# Patient Record
Sex: Female | Born: 1945
Health system: Southern US, Community
[De-identification: ages and names within clinical notes are randomized; demographics above are authoritative.]

## PROBLEM LIST (undated history)

## (undated) DIAGNOSIS — E785 Hyperlipidemia, unspecified: Secondary | ICD-10-CM

## (undated) DIAGNOSIS — G4700 Insomnia, unspecified: Secondary | ICD-10-CM

## (undated) DIAGNOSIS — E349 Endocrine disorder, unspecified: Secondary | ICD-10-CM

## (undated) DIAGNOSIS — E079 Disorder of thyroid, unspecified: Secondary | ICD-10-CM

## (undated) DIAGNOSIS — M8580 Other specified disorders of bone density and structure, unspecified site: Secondary | ICD-10-CM

## (undated) HISTORY — DX: Endocrine disorder, unspecified: E34.9

## (undated) HISTORY — DX: Other specified disorders of bone density and structure, unspecified site: M85.80

## (undated) HISTORY — DX: Disorder of thyroid, unspecified: E07.9

## (undated) HISTORY — PX: NO PAST SURGERIES: SHX2092

## (undated) HISTORY — DX: Hyperlipidemia, unspecified: E78.5

## (undated) HISTORY — DX: Insomnia, unspecified: G47.00

---

## 1998-01-07 ENCOUNTER — Other Ambulatory Visit: Admission: RE | Admit: 1998-01-07 | Discharge: 1998-01-07 | Payer: Self-pay | Admitting: *Deleted

## 1999-01-18 ENCOUNTER — Other Ambulatory Visit: Admission: RE | Admit: 1999-01-18 | Discharge: 1999-01-18 | Payer: Self-pay | Admitting: *Deleted

## 2000-02-01 HISTORY — PX: BREAST CYST ASPIRATION: SHX578

## 2000-03-16 ENCOUNTER — Encounter: Admission: RE | Admit: 2000-03-16 | Discharge: 2000-03-16 | Payer: Self-pay | Admitting: *Deleted

## 2000-03-16 ENCOUNTER — Encounter: Payer: Self-pay | Admitting: *Deleted

## 2000-05-22 ENCOUNTER — Other Ambulatory Visit: Admission: RE | Admit: 2000-05-22 | Discharge: 2000-05-22 | Payer: Self-pay | Admitting: *Deleted

## 2000-09-19 ENCOUNTER — Encounter: Admission: RE | Admit: 2000-09-19 | Discharge: 2000-09-19 | Payer: Self-pay | Admitting: *Deleted

## 2000-09-19 ENCOUNTER — Encounter: Payer: Self-pay | Admitting: *Deleted

## 2000-11-24 ENCOUNTER — Other Ambulatory Visit: Admission: RE | Admit: 2000-11-24 | Discharge: 2000-11-24 | Payer: Self-pay | Admitting: *Deleted

## 2000-11-24 ENCOUNTER — Encounter (INDEPENDENT_AMBULATORY_CARE_PROVIDER_SITE_OTHER): Payer: Self-pay | Admitting: *Deleted

## 2000-11-24 ENCOUNTER — Encounter: Payer: Self-pay | Admitting: *Deleted

## 2000-11-24 ENCOUNTER — Encounter: Admission: RE | Admit: 2000-11-24 | Discharge: 2000-11-24 | Payer: Self-pay | Admitting: *Deleted

## 2001-12-20 ENCOUNTER — Encounter: Admission: RE | Admit: 2001-12-20 | Discharge: 2001-12-20 | Payer: Self-pay | Admitting: *Deleted

## 2001-12-20 ENCOUNTER — Encounter: Payer: Self-pay | Admitting: *Deleted

## 2002-06-07 ENCOUNTER — Emergency Department (HOSPITAL_COMMUNITY): Admission: EM | Admit: 2002-06-07 | Discharge: 2002-06-07 | Payer: Self-pay | Admitting: Emergency Medicine

## 2003-08-21 ENCOUNTER — Encounter: Admission: RE | Admit: 2003-08-21 | Discharge: 2003-08-21 | Payer: Self-pay | Admitting: Gynecology

## 2003-08-22 ENCOUNTER — Other Ambulatory Visit: Admission: RE | Admit: 2003-08-22 | Discharge: 2003-08-22 | Payer: Self-pay | Admitting: Gynecology

## 2004-08-23 ENCOUNTER — Other Ambulatory Visit: Admission: RE | Admit: 2004-08-23 | Discharge: 2004-08-23 | Payer: Self-pay | Admitting: Gynecology

## 2005-03-24 ENCOUNTER — Encounter: Admission: RE | Admit: 2005-03-24 | Discharge: 2005-03-24 | Payer: Self-pay | Admitting: Gynecology

## 2006-01-06 ENCOUNTER — Encounter: Admission: RE | Admit: 2006-01-06 | Discharge: 2006-01-06 | Payer: Self-pay | Admitting: Gynecology

## 2006-01-27 ENCOUNTER — Other Ambulatory Visit: Admission: RE | Admit: 2006-01-27 | Discharge: 2006-01-27 | Payer: Self-pay | Admitting: Gynecology

## 2006-05-22 ENCOUNTER — Encounter: Admission: RE | Admit: 2006-05-22 | Discharge: 2006-05-22 | Payer: Self-pay | Admitting: Gynecology

## 2007-01-29 ENCOUNTER — Emergency Department (HOSPITAL_COMMUNITY): Admission: EM | Admit: 2007-01-29 | Discharge: 2007-01-29 | Payer: Self-pay | Admitting: *Deleted

## 2007-06-22 ENCOUNTER — Other Ambulatory Visit: Admission: RE | Admit: 2007-06-22 | Discharge: 2007-06-22 | Payer: Self-pay | Admitting: Gynecology

## 2007-06-22 ENCOUNTER — Encounter: Payer: Self-pay | Admitting: Internal Medicine

## 2007-06-27 ENCOUNTER — Ambulatory Visit: Payer: Self-pay | Admitting: Internal Medicine

## 2007-06-27 DIAGNOSIS — E039 Hypothyroidism, unspecified: Secondary | ICD-10-CM | POA: Insufficient documentation

## 2007-06-27 DIAGNOSIS — R42 Dizziness and giddiness: Secondary | ICD-10-CM | POA: Insufficient documentation

## 2007-06-27 DIAGNOSIS — R519 Headache, unspecified: Secondary | ICD-10-CM | POA: Insufficient documentation

## 2007-06-27 DIAGNOSIS — E785 Hyperlipidemia, unspecified: Secondary | ICD-10-CM | POA: Insufficient documentation

## 2007-06-27 DIAGNOSIS — R51 Headache: Secondary | ICD-10-CM | POA: Insufficient documentation

## 2007-06-27 DIAGNOSIS — F5101 Primary insomnia: Secondary | ICD-10-CM | POA: Insufficient documentation

## 2007-06-28 ENCOUNTER — Telehealth: Payer: Self-pay | Admitting: *Deleted

## 2007-07-02 ENCOUNTER — Ambulatory Visit: Payer: Self-pay | Admitting: Internal Medicine

## 2007-07-02 LAB — CONVERTED CEMR LAB
BUN: 12 mg/dL (ref 6–23)
Creatinine, Ser: 0.6 mg/dL (ref 0.4–1.2)

## 2007-07-04 ENCOUNTER — Encounter: Admission: RE | Admit: 2007-07-04 | Discharge: 2007-07-04 | Payer: Self-pay | Admitting: Internal Medicine

## 2007-07-05 ENCOUNTER — Telehealth: Payer: Self-pay | Admitting: *Deleted

## 2007-07-06 ENCOUNTER — Telehealth: Payer: Self-pay | Admitting: *Deleted

## 2007-07-09 ENCOUNTER — Encounter: Payer: Self-pay | Admitting: Internal Medicine

## 2007-07-25 ENCOUNTER — Encounter: Payer: Self-pay | Admitting: Internal Medicine

## 2008-01-09 ENCOUNTER — Encounter: Admission: RE | Admit: 2008-01-09 | Discharge: 2008-01-09 | Payer: Self-pay | Admitting: Gynecology

## 2008-07-17 ENCOUNTER — Other Ambulatory Visit: Admission: RE | Admit: 2008-07-17 | Discharge: 2008-07-17 | Payer: Self-pay | Admitting: Gynecology

## 2008-07-17 ENCOUNTER — Ambulatory Visit: Payer: Self-pay | Admitting: Gynecology

## 2008-07-17 ENCOUNTER — Encounter: Payer: Self-pay | Admitting: Gynecology

## 2009-04-22 ENCOUNTER — Encounter: Admission: RE | Admit: 2009-04-22 | Discharge: 2009-04-22 | Payer: Self-pay | Admitting: Gynecology

## 2010-03-04 NOTE — Progress Notes (Signed)
Summary: returning Shannon's call   Phone Note Call from Patient Call back at Home Phone [phone]   Caller: pt vm triage Call For: Panosh  Summary of Call: returning Shannon's call  Initial call taken by: Roselle Locus,  July 05, 2007 11:15 AM  Follow-up for Phone Call        Left message to call back. Follow-up by: Romualdo Bolk, CMA,  July 05, 2007 11:32 AM  Additional Follow-up for Phone Call Additional follow up Details #1::        pt called again call [phone] Roselle Locus  July 05, 2007 11:51 AM Left message to call back. Romualdo Bolk, CMA  July 06, 2007 10:03 AM Pt aware of results.  Additional Follow-up by: Romualdo Bolk, CMA,  July 06, 2007 12:37 PM

## 2010-03-04 NOTE — Progress Notes (Signed)
Summary: needs BUN and Creat  Phone Note Outgoing Call Call back at [facility] Phone [phone]   Call placed by: Romualdo Bolk, CMA,  Jun 28, 2007 10:56 AM Summary of Call: Pt needs a BUN and Creat. I called pt and left her a message to call back. Initial call taken by: Romualdo Bolk, CMA,  Jun 28, 2007 10:57 AM  Follow-up for Phone Call        appt 06/01 @ 2:00 Patient aware Follow-up by: Florentina Addison,  Jun 29, 2007 3:51 PM

## 2010-03-04 NOTE — Consult Note (Signed)
Summary: Dr Lily Peer note  Dr Lily Peer note   Imported By: Kassie Mends 07/27/2007 10:05:28  _____________________________________________________________________  External Attachment:    Type:   Image     Comment:   Dr Lily Peer note

## 2010-03-04 NOTE — Progress Notes (Signed)
Summary: pls call @ work #   Phone Note Call from Patient Call back at [phone]   Caller: pt vm triage Call For: Panosh  Summary of Call: pls call at work Number Initial call taken by: Roselle Locus,  July 06, 2007 12:22 PM  Follow-up for Phone Call        Pt aware of results. Follow-up by: Romualdo Bolk, CMA,  July 06, 2007 12:35 PM

## 2010-03-04 NOTE — Consult Note (Signed)
Summary: [facility]   Imported By: Lanelle Bal 07/11/2007 12:56:35  _____________________________________________________________________  External Attachment:    Type:   Image     Comment:   External Document

## 2010-03-04 NOTE — Letter (Signed)
Summary: medical records  medical records   Imported By: Kassie Mends 07/25/2007 10:40:01  _____________________________________________________________________  External Attachment:    Type:   Image     Comment:   medical records
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
## 2010-04-15 ENCOUNTER — Encounter (INDEPENDENT_AMBULATORY_CARE_PROVIDER_SITE_OTHER): Payer: PRIVATE HEALTH INSURANCE | Admitting: Gynecology

## 2010-04-15 ENCOUNTER — Other Ambulatory Visit (HOSPITAL_COMMUNITY)
Admission: RE | Admit: 2010-04-15 | Discharge: 2010-04-15 | Disposition: A | Discharge status: Home / Self Care | Payer: PRIVATE HEALTH INSURANCE | Source: Ambulatory Visit | Attending: Gynecology | Admitting: Gynecology

## 2010-04-15 ENCOUNTER — Other Ambulatory Visit: Payer: Self-pay | Admitting: Gynecology

## 2010-04-15 DIAGNOSIS — E039 Hypothyroidism, unspecified: Secondary | ICD-10-CM

## 2010-04-15 DIAGNOSIS — E7800 Pure hypercholesterolemia, unspecified: Secondary | ICD-10-CM

## 2010-04-15 DIAGNOSIS — Z01419 Encounter for gynecological examination (general) (routine) without abnormal findings: Secondary | ICD-10-CM | POA: Insufficient documentation

## 2010-04-15 DIAGNOSIS — Z1211 Encounter for screening for malignant neoplasm of colon: Secondary | ICD-10-CM

## 2010-04-15 DIAGNOSIS — R81 Glycosuria: Secondary | ICD-10-CM

## 2010-04-15 DIAGNOSIS — Z13220 Encounter for screening for lipoid disorders: Secondary | ICD-10-CM

## 2010-04-15 DIAGNOSIS — Z124 Encounter for screening for malignant neoplasm of cervix: Secondary | ICD-10-CM

## 2010-04-15 DIAGNOSIS — R635 Abnormal weight gain: Secondary | ICD-10-CM

## 2010-05-20 ENCOUNTER — Other Ambulatory Visit: Payer: Self-pay | Admitting: Gynecology

## 2010-05-20 DIAGNOSIS — Z1231 Encounter for screening mammogram for malignant neoplasm of breast: Secondary | ICD-10-CM

## 2010-05-21 ENCOUNTER — Ambulatory Visit
Admission: RE | Admit: 2010-05-21 | Discharge: 2010-05-21 | Disposition: A | Discharge status: Home / Self Care | Payer: PRIVATE HEALTH INSURANCE | Source: Ambulatory Visit | Attending: Gynecology | Admitting: Gynecology

## 2010-05-21 DIAGNOSIS — Z1231 Encounter for screening mammogram for malignant neoplasm of breast: Secondary | ICD-10-CM

## 2010-06-30 ENCOUNTER — Encounter (INDEPENDENT_AMBULATORY_CARE_PROVIDER_SITE_OTHER): Payer: PRIVATE HEALTH INSURANCE

## 2010-06-30 DIAGNOSIS — M899 Disorder of bone, unspecified: Secondary | ICD-10-CM

## 2010-06-30 DIAGNOSIS — M949 Disorder of cartilage, unspecified: Secondary | ICD-10-CM

## 2011-11-23 ENCOUNTER — Other Ambulatory Visit: Payer: Self-pay | Admitting: Gynecology

## 2011-11-23 DIAGNOSIS — Z1231 Encounter for screening mammogram for malignant neoplasm of breast: Secondary | ICD-10-CM

## 2011-12-30 ENCOUNTER — Ambulatory Visit
Admission: RE | Admit: 2011-12-30 | Discharge: 2011-12-30 | Disposition: A | Discharge status: Home / Self Care | Payer: PRIVATE HEALTH INSURANCE | Source: Ambulatory Visit | Attending: Gynecology | Admitting: Gynecology

## 2011-12-30 DIAGNOSIS — Z1231 Encounter for screening mammogram for malignant neoplasm of breast: Secondary | ICD-10-CM

## 2012-01-20 ENCOUNTER — Encounter: Payer: PRIVATE HEALTH INSURANCE | Admitting: Gynecology

## 2012-02-02 ENCOUNTER — Encounter: Payer: PRIVATE HEALTH INSURANCE | Admitting: Gynecology

## 2012-02-03 ENCOUNTER — Encounter: Payer: PRIVATE HEALTH INSURANCE | Admitting: Gynecology

## 2012-02-17 ENCOUNTER — Encounter: Payer: Self-pay | Admitting: Gynecology

## 2012-02-17 ENCOUNTER — Ambulatory Visit (INDEPENDENT_AMBULATORY_CARE_PROVIDER_SITE_OTHER): Payer: Medicare Other | Admitting: Gynecology

## 2012-02-17 VITALS — BP 128/78 | Ht 67.0 in | Wt 155.0 lb

## 2012-02-17 DIAGNOSIS — M954 Acquired deformity of chest and rib: Secondary | ICD-10-CM | POA: Insufficient documentation

## 2012-02-17 DIAGNOSIS — M899 Disorder of bone, unspecified: Secondary | ICD-10-CM

## 2012-02-17 DIAGNOSIS — M8580 Other specified disorders of bone density and structure, unspecified site: Secondary | ICD-10-CM | POA: Insufficient documentation

## 2012-02-17 DIAGNOSIS — E785 Hyperlipidemia, unspecified: Secondary | ICD-10-CM

## 2012-02-17 DIAGNOSIS — E039 Hypothyroidism, unspecified: Secondary | ICD-10-CM

## 2012-02-17 DIAGNOSIS — Z780 Asymptomatic menopausal state: Secondary | ICD-10-CM

## 2012-02-17 LAB — CBC WITH DIFFERENTIAL/PLATELET
Basophils Absolute: 0 10*3/uL (ref 0.0–0.1)
Basophils Relative: 0 % (ref 0–1)
Eosinophils Absolute: 0.1 10*3/uL (ref 0.0–0.7)
Eosinophils Relative: 1 % (ref 0–5)
HCT: 42.8 % (ref 36.0–46.0)
Hemoglobin: 14.3 g/dL (ref 12.0–15.0)
Lymphocytes Relative: 31 % (ref 12–46)
Lymphs Abs: 2.6 10*3/uL (ref 0.7–4.0)
MCH: 29.8 pg (ref 26.0–34.0)
MCHC: 33.4 g/dL (ref 30.0–36.0)
MCV: 89.2 fL (ref 78.0–100.0)
Monocytes Absolute: 0.5 10*3/uL (ref 0.1–1.0)
Monocytes Relative: 5 % (ref 3–12)
Neutro Abs: 5.3 10*3/uL (ref 1.7–7.7)
Neutrophils Relative %: 63 % (ref 43–77)
Platelets: 291 10*3/uL (ref 150–400)
RBC: 4.8 MIL/uL (ref 3.87–5.11)
RDW: 13.1 % (ref 11.5–15.5)
WBC: 8.4 10*3/uL (ref 4.0–10.5)

## 2012-02-17 LAB — COMPREHENSIVE METABOLIC PANEL
ALT: 17 U/L (ref 0–35)
AST: 20 U/L (ref 0–37)
Albumin: 4.6 g/dL (ref 3.5–5.2)
Alkaline Phosphatase: 83 U/L (ref 39–117)
BUN: 10 mg/dL (ref 6–23)
CO2: 26 mEq/L (ref 19–32)
Calcium: 9.5 mg/dL (ref 8.4–10.5)
Chloride: 105 mEq/L (ref 96–112)
Creat: 0.81 mg/dL (ref 0.50–1.10)
Glucose, Bld: 94 mg/dL (ref 70–99)
Potassium: 4.1 mEq/L (ref 3.5–5.3)
Sodium: 141 mEq/L (ref 135–145)
Total Bilirubin: 0.6 mg/dL (ref 0.3–1.2)
Total Protein: 7 g/dL (ref 6.0–8.3)

## 2012-02-17 LAB — LIPID PANEL
Cholesterol: 286 mg/dL — ABNORMAL HIGH (ref 0–200)
HDL: 52 mg/dL (ref >39)
LDL Cholesterol: 212 mg/dL — ABNORMAL HIGH (ref 0–99)
Total CHOL/HDL Ratio: 5.5 Ratio
Triglycerides: 109 mg/dL (ref <150)
VLDL: 22 mg/dL (ref 0–40)

## 2012-02-17 LAB — TSH: TSH: 1.541 u[IU]/mL (ref 0.350–4.500)
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
## 2012-02-17 NOTE — Patient Instructions (Addendum)
Shingles Shingles is caused by the same virus that causes chickenpox (varicella zoster virus or VZV). Shingles often occurs many years or decades after having chickenpox. That is why it is more common in adults older than 50 years. The virus reactivates and breaks out as an infection in a nerve root. SYMPTOMS   The initial feeling (sensations) may be pain. This pain is usually described as:  Burning.  Stabbing.  Throbbing.  Tingling in the nerve root.  A red rash will follow in a couple days. The rash may occur in any area of the body and is usually on one side (unilateral) of the body in a band or belt-like pattern. The rash usually starts out as very small blisters (vesicles). They will dry up after 7 to 10 days. This is not usually a significant problem except for the pain it causes.  Long-lasting (chronic) pain is more likely in an elderly person. It can last months to years. This condition is called postherpetic neuralgia. Shingles can be an extremely severe infection in someone with AIDS, a weakened immune system, or with forms of leukemia. It can also be severe if you are taking transplant medicines or other medicines that weaken the immune system. TREATMENT  Your caregiver will often treat you with:  Antiviral drugs.  Anti-inflammatory drugs.  Pain medicines. Bed rest is very important in preventing the pain associated with herpes zoster (postherpetic neuralgia). Application of heat in the form of a hot water bottle or electric heating pad or gentle pressure with the hand is recommended to help with the pain or discomfort. PREVENTION  A varicella zoster vaccine is available to help protect against the virus. The Food and Drug Administration approved the varicella zoster vaccine for individuals 14 years of age and older. HOME CARE INSTRUCTIONS   Cool compresses to the area of rash may be helpful.  Only take over-the-counter or prescription medicines for pain, discomfort, or  fever as directed by your caregiver.  Avoid contact with:  Babies.  Pregnant women.  Children with eczema.  Elderly people with transplants.  People with chronic illnesses, such as leukemia and AIDS.  If the area involved is on your face, you may receive a referral for follow-up to a specialist. It is very important to keep all follow-up appointments. This will help avoid eye complications, chronic pain, or disability. SEEK IMMEDIATE MEDICAL CARE IF:   You develop any pain (headache) in the area of the face or eye. This must be followed carefully by your caregiver or ophthalmologist. An infection in part of your eye (cornea) can be very serious. It could lead to blindness.  You do not have pain relief from prescribed medicines.  Your redness or swelling spreads.  The area involved becomes very swollen and painful.  You have a fever.  You notice any red or painful lines extending away from the affected area toward your heart (lymphangitis).  Your condition is worsening or has changed. Document Released: 01/17/2005 Document Revised: 04/11/2011 Document Reviewed: 12/22/2008 [facility] Patient Information 2013 [place], [place].  Tetanus, Diphtheria, Pertussis (Tdap) Vaccine What You Need to Know WHY GET VACCINATED? Tetanus, diphtheria and pertussis can be very serious diseases, even for adolescents and adults. Tdap vaccine can protect [place] from these diseases. TETANUS (Lockjaw) causes painful muscle tightening and stiffness, usually all over the body.  It can lead to tightening of muscles in the head and neck so you can't open your mouth, swallow, or sometimes even breathe. Tetanus kills about 1 out  of 5 people who are infected. DIPHTHERIA can cause a thick coating to form in the back of the throat.  It can lead to breathing problems, paralysis, heart failure, and death. PERTUSSIS (Whooping Cough) causes severe coughing spells, which can cause difficulty breathing, vomiting and  disturbed sleep.  It can also lead to weight loss, incontinence, and rib fractures. Up to 2 in 100 adolescents and 5 in 100 adults with pertussis are hospitalized or have complications, which could include pneumonia and death. These diseases are caused by bacteria. Diphtheria and pertussis are spread from person to person through coughing or sneezing. Tetanus enters the body through cuts, scratches, or wounds. Before vaccines, the [place] saw as many as 200,000 cases a year of diphtheria and pertussis, and hundreds of cases of tetanus. Since vaccination began, tetanus and diphtheria have dropped by about 99% and pertussis by about 80%. TDAP VACCINE Tdap vaccine can protect adolescents and adults from tetanus, diphtheria, and pertussis. One dose of Tdap is routinely given at age 21 or 13. People who did not get Tdap at that age should get it as soon as possible. Tdap is especially important for health care professionals and anyone having close contact with a baby younger than 12 months. Pregnant women should get a dose of Tdap during every pregnancy, to protect the newborn from pertussis. Infants are most at risk for severe, life-threatening complications from pertussis. A similar vaccine, called Td, protects from tetanus and diphtheria, but not pertussis. A Td booster should be given every 10 years. Tdap may be given as one of these boosters if you have not already gotten a dose. Tdap may also be given after a severe cut or burn to prevent tetanus infection. Your doctor can give you more information. Tdap may safely be given at the same time as other vaccines. SOME PEOPLE SHOULD NOT GET THIS VACCINE  If you ever had a life-threatening allergic reaction after a dose of any tetanus, diphtheria, or pertussis containing vaccine, OR if you have a severe allergy to any part of this vaccine, you should not get Tdap. Tell your doctor if you have any severe allergies.  If you had a coma, or long or  multiple seizures within 7 days after a childhood dose of DTP or DTaP, you should not get Tdap, unless a cause other than the vaccine was found. You can still get Td.  Talk to your doctor if you:  have epilepsy or another nervous system problem,  had severe pain or swelling after any vaccine containing diphtheria, tetanus or pertussis,  ever had Guillain-Barr Syndrome (GBS),  aren't feeling well on the day the shot is scheduled. RISKS OF A VACCINE REACTION With any medicine, including vaccines, there is a chance of side effects. These are usually mild and go away on their own, but serious reactions are also possible. Brief fainting spells can follow a vaccination, leading to injuries from falling. Sitting or lying down for about 15 minutes can help prevent these. Tell your doctor if you feel dizzy or light-headed, or have vision changes or ringing in the ears. Mild problems following Tdap (Did not interfere with activities)  Pain where the shot was given (about 3 in 4 adolescents or 2 in 3 adults)  Redness or swelling where the shot was given (about 1 person in 5)  Mild fever of at least 100.75F (up to about 1 in 25 adolescents or 1 in 100 adults)  Headache (about 3 or 4 people in 10)  Tiredness (about 1 person in 3 or 4)  Nausea, vomiting, diarrhea, stomach ache (up to 1 in 4 adolescents or 1 in 10 adults)  Chills, body aches, sore joints, rash, swollen glands (uncommon) Moderate problems following Tdap (Interfered with activities, but did not require medical attention)  Pain where the shot was given (about 1 in 5 adolescents or 1 in 100 adults)  Redness or swelling where the shot was given (up to about 1 in 16 adolescents or 1 in 25 adults)  Fever over 102F (about 1 in 100 adolescents or 1 in 250 adults)  Headache (about 3 in 20 adolescents or 1 in 10 adults)  Nausea, vomiting, diarrhea, stomach ache (up to 1 or 3 people in 100)  Swelling of the entire arm where the shot  was given (up to about 3 in 100). Severe problems following Tdap (Unable to perform usual activities, required medical attention)  Swelling, severe pain, bleeding and redness in the arm where the shot was given (rare). A severe allergic reaction could occur after any vaccine (estimated less than 1 in a million doses). WHAT IF THERE IS A SERIOUS REACTION? What should I look for?  Look for anything that concerns you, such as signs of a severe allergic reaction, very high fever, or behavior changes. Signs of a severe allergic reaction can include hives, swelling of the face and throat, difficulty breathing, a fast heartbeat, dizziness, and weakness. These would start a few minutes to a few hours after the vaccination. What should I do?  If you think it is a severe allergic reaction or other emergency that can't wait, call 9-1-1 or get the person to the nearest hospital. Otherwise, call your doctor.  Afterward, the reaction should be reported to the "Vaccine Adverse Event Reporting System" (VAERS). Your doctor might file this report, or you can do it yourself through the VAERS web site at www.vaers.LAgents.no, or by calling 1-(618)515-6981. VAERS is only for reporting reactions. They do not give medical advice.  THE NATIONAL VACCINE INJURY COMPENSATION PROGRAM The National Vaccine Injury Compensation Program (VICP) is a federal program that was created to compensate people who may have been injured by certain vaccines. Persons who believe they may have been injured by a vaccine can learn about the program and about filing a claim by calling 1-(631)180-7281 or visiting the VICP website at SpiritualWord.at. HOW CAN I LEARN MORE?  Ask your doctor.  Call your local or state health department.  Contact the Centers for Disease Control and Prevention (CDC):  Call (217) 524-0505 or visit CDC's website at PicCapture.uy CDC Tdap Vaccine VIS (06/09/11) Document Released: 07/19/2011  Document Reviewed: 07/19/2011 [facility] Patient Information 2013 [place], [place].

## 2012-02-18 LAB — URINALYSIS W MICROSCOPIC + REFLEX CULTURE
Bacteria, UA: NONE SEEN
Casts: NONE SEEN
Crystals: NONE SEEN
Glucose, UA: NEGATIVE mg/dL
Hgb urine dipstick: NEGATIVE
Leukocytes, UA: NEGATIVE
Nitrite: NEGATIVE
Protein, ur: NEGATIVE mg/dL
Specific Gravity, Urine: 1.023 (ref 1.005–1.030)
Urobilinogen, UA: 0.2 mg/dL (ref 0.0–1.0)
pH: 5.5 (ref 5.0–8.0)

## 2012-03-01 ENCOUNTER — Ambulatory Visit (HOSPITAL_COMMUNITY)
Admission: RE | Admit: 2012-03-01 | Discharge: 2012-03-01 | Disposition: A | Discharge status: Home / Self Care | Payer: Medicare Other | Source: Ambulatory Visit | Attending: Gynecology | Admitting: Gynecology

## 2012-03-01 DIAGNOSIS — M954 Acquired deformity of chest and rib: Secondary | ICD-10-CM | POA: Insufficient documentation

## 2012-04-04 ENCOUNTER — Telehealth: Payer: Self-pay | Admitting: *Deleted

## 2012-04-04 NOTE — Telephone Encounter (Signed)
Pt informed with the below note,she asked if report could be faxed to Dr.Cynthis Charm Barges at [phone] (office #) report faxed.

## 2012-04-04 NOTE — Telephone Encounter (Signed)
Please inform patient that it is a benign bony deformity of her chest wall but the rest of her chest x-ray was normal. Her recent mammogram was normal also. I would recommend she followup with her primary physician

## 2012-04-04 NOTE — Telephone Encounter (Signed)
Pt calling requesting recent chest x-ray results. Please advise

## 2013-03-06 ENCOUNTER — Other Ambulatory Visit: Payer: Self-pay

## 2013-03-06 DIAGNOSIS — Z1231 Encounter for screening mammogram for malignant neoplasm of breast: Secondary | ICD-10-CM

## 2013-03-22 ENCOUNTER — Ambulatory Visit
Admission: RE | Admit: 2013-03-22 | Discharge: 2013-03-22 | Disposition: A | Discharge status: Home / Self Care | Payer: Medicare Other | Source: Ambulatory Visit

## 2013-03-22 DIAGNOSIS — Z1231 Encounter for screening mammogram for malignant neoplasm of breast: Secondary | ICD-10-CM

## 2013-04-05 ENCOUNTER — Encounter: Payer: Self-pay | Admitting: Gynecology

## 2013-04-26 ENCOUNTER — Ambulatory Visit (INDEPENDENT_AMBULATORY_CARE_PROVIDER_SITE_OTHER): Payer: Medicare Other | Admitting: Gynecology

## 2013-04-26 ENCOUNTER — Encounter: Payer: Self-pay | Admitting: Gynecology

## 2013-04-26 VITALS — BP 128/78 | Ht 67.25 in | Wt 151.0 lb

## 2013-04-26 DIAGNOSIS — M899 Disorder of bone, unspecified: Secondary | ICD-10-CM

## 2013-04-26 DIAGNOSIS — M8580 Other specified disorders of bone density and structure, unspecified site: Secondary | ICD-10-CM

## 2013-04-26 DIAGNOSIS — N952 Postmenopausal atrophic vaginitis: Secondary | ICD-10-CM

## 2013-04-26 DIAGNOSIS — M949 Disorder of cartilage, unspecified: Secondary | ICD-10-CM

## 2013-04-26 DIAGNOSIS — Z86010 Personal history of colonic polyps: Secondary | ICD-10-CM

## 2013-04-26 NOTE — Progress Notes (Signed)
Pamela Henderson [birth year] [id number]   History:    68 y.o.  for GYN exam. Patient with no major complaints today. Patient lost her mother recently and has felt nervous at times but otherwise was coping well. Last year she had been concerned some slight chest deformity and was sent for a chest x-ray PA and lateral and it appears to be more congenital no other abnormality noted.Her colonoscopy in 2012 had benign polyps. Her last bone density study demonstrated she is osteopenia and he was in 2012. Her lowest T score was -2.1 at the left femoral neck and she had a normal Frax analysis. Patient states she's taking calcium and vitamin D and exercises regularly. Her primary physician is Dr. Melina Copa who has been treating her for hyperlipidemia and hypothyroidism.Review of her record also indicated that she had been on Actonel for 2 years in the past and had discontinued on her own.  Patient with no prior history of abnormal Pap smears  Past medical history,surgical history, family history and social history were all reviewed and documented in the EPIC chart.  Gynecologic History No LMP recorded. Patient is postmenopausal. Contraception: post menopausal status Last Pap: 2012. Results were: normal Last mammogram: 2015. Results were: normal  Obstetric History OB History  Gravida Para Term Preterm AB SAB TAB Ectopic Multiple Living  1 1        1     # Outcome Date GA Lbr Len/2nd Weight Sex Delivery Anes PTL Lv  1 PAR                ROS: A ROS was performed and pertinent positives and negatives are included in the history.  GENERAL: No fevers or chills. HEENT: No change in vision, no earache, sore throat or sinus congestion. NECK: No pain or stiffness. CARDIOVASCULAR: No chest pain or pressure. No palpitations. PULMONARY: No shortness of breath, cough or wheeze. GASTROINTESTINAL: No abdominal pain, nausea, vomiting or diarrhea, melena or bright red blood per rectum. GENITOURINARY: No urinary frequency,  urgency, hesitancy or dysuria. MUSCULOSKELETAL: No joint or muscle pain, no back pain, no recent trauma. DERMATOLOGIC: No rash, no itching, no lesions. ENDOCRINE: No polyuria, polydipsia, no heat or cold intolerance. No recent change in weight. HEMATOLOGICAL: No anemia or easy bruising or bleeding. NEUROLOGIC: No headache, seizures, numbness, tingling or weakness. PSYCHIATRIC: No depression, no loss of interest in normal activity or change in sleep pattern.     Exam: chaperone present  BP 128/78  Ht 5' 7.25" (1.708 m)  Wt 151 lb (68.493 kg)  BMI 23.48 kg/m2  Body mass index is 23.48 kg/(m^2).  General appearance : Well developed well nourished female. No acute distress HEENT: Neck supple, trachea midline, no carotid bruits, no thyroidmegaly Lungs: Clear to auscultation, no rhonchi or wheezes, or rib retractions  Heart: Regular rate and rhythm, no murmurs or gallops Breast:Examined in sitting and supine position were symmetrical in appearance, no palpable masses or tenderness,  no skin retraction, no nipple inversion, no nipple discharge, no skin discoloration, no axillary or supraclavicular lymphadenopathy Abdomen: no palpable masses or tenderness, no rebound or guarding Extremities: no edema or skin discoloration or tenderness  Pelvic:  Bartholin, Urethra, Skene Glands: Within normal limits             Vagina: No gross lesions or discharge, atrophic changes   Cervix: No gross lesions or discharge  Uterus  anteverted, normal size, shape and consistency, non-tender and mobile  Adnexa  Without masses or tenderness  Anus and perineum  normal   Rectovaginal  normal sphincter tone without palpated masses or tenderness             Hemoccult PCP provides     Assessment/Plan:  68 y.o. female with history of osteopenia will be scheduling her bone density study to compare with study of 2012. Patient was provided with a requisition to have her shingles vaccine. She was encouraged to continue to  take her calcium and vitamin D along with regular exercise for osteoporosis prevention. Pap smear no longer needed in accordance to the new guidelines. She will followup with her PCP for her blood work. She will need a followup colonoscopy in 2 years because of her history of colon polyps.   Note: This dictation was prepared with  Dragon/digital dictation along withSmart phrase technology. Any transcriptional errors that result from this process are unintentional.   Terrance Mass MD, 10:42 AM 04/26/2013

## 2013-04-26 NOTE — Patient Instructions (Addendum)
Bone Densitometry Bone densitometry is a special X-ray that measures your bone density and can be used to help predict your risk of bone fractures. This test is used to determine bone mineral content and density to diagnose osteoporosis. Osteoporosis is the loss of bone that may cause the bone to become weak. Osteoporosis commonly occurs in women entering menopause. However, it may be found in men and in people with other diseases. PREPARATION FOR TEST No preparation necessary. WHO SHOULD BE TESTED?  All women older than 66.  Postmenopausal women (50 to 39) with risk factors for osteoporosis.  People with a previous fracture caused by normal activities.  People with a small body frame (less than 127 poundsor a body mass index [BMI] of less than 21).  People who have a parent with a hip fracture or history of osteoporosis.  People who smoke.  People who have rheumatoid arthritis.  Anyone who engages in excessive alcohol use (more than 3 drinks most days).  Women who experience early menopause. WHEN SHOULD YOU BE RETESTED? Current guidelines suggest that you should wait at least 2 years before doing a bone density test again if your first test was normal.Recent studies indicated that women with normal bone density may be able to wait a few years before needing to repeat a bone density test. You should discuss this with your caregiver.  NORMAL FINDINGS   Normal: less than standard deviation below normal (greater than -1).  Osteopenia: 1 to 2.5 standard deviations below normal (-1 to -2.5).  Osteoporosis: greater than 2.5 standard deviations below normal (less than -2.5). Test results are reported as a "T score" and a "Z score."The T score is a number that compares your bone density with the bone density of healthy, young women.The Z score is a number that compares your bone density with the scores of women who are the same age, gender, and race.  Ranges for normal findings may vary  among different laboratories and hospitals. You should always check with your doctor after having lab work or other tests done to discuss the meaning of your test results and whether your values are considered within normal limits. MEANING OF TEST  Your caregiver will go over the test results with you and discuss the importance and meaning of your results, as well as treatment options and the need for additional tests if necessary. OBTAINING THE TEST RESULTS It is your responsibility to obtain your test results. Ask the lab or department performing the test when and how you will get your results. Document Released: 02/09/2004 Document Revised: 04/11/2011 Document Reviewed: 03/03/2010 [facility] Patient Information 2014 [place]. Shingles Vaccine What You Need to Know WHAT IS SHINGLES?  Shingles is a painful skin rash, often with blisters. It is also called Herpes Zoster or just Zoster.  A shingles rash usually appears on one side of the face or body and lasts from 2 to 4 weeks. Its main symptom is pain, which can be quite severe. Other symptoms of shingles can include fever, headache, chills, and upset stomach. Very rarely, a shingles infection can lead to pneumonia, hearing problems, blindness, brain inflammation (encephalitis), or death.  For about 1 person in 5, severe pain can continue even after the rash clears up. This is called post-herpetic neuralgia.  Shingles is caused by the Varicella Zoster virus. This is the same virus that causes chickenpox. Only someone who has had a case of chickenpox or rarely, has gotten chickenpox vaccine, can get shingles. The virus stays  in your body. It can reappear many years later to cause a case of shingles.  You cannot catch shingles from another person with shingles. However, a person who has never had chickenpox (or chickenpox vaccine) could get chickenpox from someone with shingles. This is not very common.  Shingles is far more common in people  44 and older than in younger people. It is also more common in people whose immune systems are weakened because of a disease such as cancer or drugs such as steroids or chemotherapy.  At least 1 million people get shingles per year in the [place]. SHINGLES VACCINE  A vaccine for shingles was licensed in 1027. In clinical trials, the vaccine reduced the risk of shingles by 50%. It can also reduce the pain in people who still get shingles after being vaccinated.  A single dose of shingles vaccine is recommended for adults 44 years of age and older. SOME PEOPLE SHOULD NOT GET SHINGLES VACCINE OR SHOULD WAIT A person should not get shingles vaccine if he or she:  Has ever had a life-threatening allergic reaction to gelatin, the antibiotic neomycin, or any other component of shingles vaccine. Tell your caregiver if you have any severe allergies.  Has a weakened immune system because of current:  AIDS or another disease that affects the immune system.  Treatment with drugs that affect the immune system, such as prolonged use of high-dose steroids.  Cancer treatment, such as radiation or chemotherapy.  Cancer affecting the bone marrow or lymphatic system, such as leukemia or lymphoma.  Is pregnant, or might be pregnant. Women should not become pregnant until at least 4 weeks after getting shingles vaccine. Someone with a minor illness, such as a cold, may be vaccinated. Anyone with a moderate or severe acute illness should usually wait until he or she recovers before getting the vaccine. This includes anyone with a temperature of 101.3 F (38 C) or higher. WHAT ARE THE RISKS FROM SHINGLES VACCINE?  A vaccine, like any medicine, could possibly cause serious problems, such as severe allergic reactions. However, the risk of a vaccine causing serious harm, or death, is extremely small.  No serious problems have been identified with shingles vaccine. Mild Problems  Redness, soreness,  swelling, or itching at the site of the injection (about 1 person in 3).  Headache (about 1 person in 24). Like all vaccines, shingles vaccine is being closely monitored for unusual or severe problems. WHAT IF THERE IS A MODERATE OR SEVERE REACTION? What should I look for? Any unusual condition, such as a severe allergic reaction or a high fever. If a severe allergic reaction occurred, it would be within a few minutes to an hour after the shot. Signs of a serious allergic reaction can include difficulty breathing, weakness, hoarseness or wheezing, a fast heartbeat, hives, dizziness, paleness, or swelling of the throat. What should I do?  Call your caregiver, or get the person to a caregiver right away.  Tell the caregiver what happened, the date and time it happened, and when the vaccination was given.  Ask the caregiver to report the reaction by filing a Vaccine Adverse Event Reporting System (VAERS) form. Or, you can file this report through the VAERS web site at www.vaers.SamedayNews.es or by calling (743) 142-6462. VAERS does not provide medical advice. HOW CAN I LEARN MORE?  Ask your caregiver. He or she can give you the vaccine package insert or suggest other sources of information.  Contact the Centers for Disease Control and  Prevention (CDC):  Call (305)579-3490 (1-800-CDC-INFO).  Visit the CDC website at http://hunter.com/ CDC Shingles Vaccine VIS (11/06/07) Document Released: 11/14/2005 Document Revised: 04/11/2011 Document Reviewed: 05/09/2012 [facility] Patient Information 2014 [place].

## 2013-12-02 ENCOUNTER — Encounter: Payer: Self-pay | Admitting: Gynecology

## 2014-04-08 ENCOUNTER — Other Ambulatory Visit: Payer: Self-pay

## 2014-09-17 ENCOUNTER — Other Ambulatory Visit: Payer: Self-pay

## 2014-09-17 DIAGNOSIS — Z1231 Encounter for screening mammogram for malignant neoplasm of breast: Secondary | ICD-10-CM

## 2014-09-23 ENCOUNTER — Ambulatory Visit
Admission: RE | Admit: 2014-09-23 | Discharge: 2014-09-23 | Disposition: A | Discharge status: Home / Self Care | Payer: Medicare HMO | Source: Ambulatory Visit

## 2014-09-23 DIAGNOSIS — Z1231 Encounter for screening mammogram for malignant neoplasm of breast: Secondary | ICD-10-CM

## 2014-12-02 ENCOUNTER — Ambulatory Visit (INDEPENDENT_AMBULATORY_CARE_PROVIDER_SITE_OTHER): Payer: Medicare HMO | Admitting: Gynecology

## 2014-12-02 ENCOUNTER — Encounter: Payer: Self-pay | Admitting: Gynecology

## 2014-12-02 VITALS — BP 108/68 | Ht 67.25 in | Wt 153.6 lb

## 2014-12-02 DIAGNOSIS — Z01419 Encounter for gynecological examination (general) (routine) without abnormal findings: Secondary | ICD-10-CM

## 2014-12-02 DIAGNOSIS — M8580 Other specified disorders of bone density and structure, unspecified site: Secondary | ICD-10-CM | POA: Diagnosis not present

## 2014-12-02 NOTE — Patient Instructions (Signed)

## 2014-12-02 NOTE — Progress Notes (Signed)
Pamela Henderson August 22, 1945 [id number]   History:    69 y.o.  for annual gyn exam with no major complaints today. Patient was last seen the office in March 2015 her PCP in [facility] has been doing her blood work. Patient is overdue for bone density study. Her last bone density study was here in our office in 2012 whereby the lowest T score was at the left femoral neck with a value of -2.1. When compared with 2007 there was a statistically significant decrease in bone mineralization. Her Frax analysis was normal. Patient with no past history of abnormal Pap smear. Many years ago she had been on hormone replacement therapy. She denies any vaginal bleeding. Her PCP has given her all her vaccines and she is up-to-date.  Past medical history,surgical history, family history and social history were all reviewed and documented in the EPIC chart.  Gynecologic History No LMP recorded. Patient is postmenopausal. Contraception: post menopausal status Last Pap: 2012. Results were: normal Last mammogram: 2016. Results were: normal but dense. Had a three-dimensional mammogram   Obstetric History OB History  Gravida Para Term Preterm AB SAB TAB Ectopic Multiple Living  1 1        1     # Outcome Date GA Lbr Len/2nd Weight Sex Delivery Anes PTL Lv  1 Para                ROS: A ROS was performed and pertinent positives and negatives are included in the history.  GENERAL: No fevers or chills. HEENT: No change in vision, no earache, sore throat or sinus congestion. NECK: No pain or stiffness. CARDIOVASCULAR: No chest pain or pressure. No palpitations. PULMONARY: No shortness of breath, cough or wheeze. GASTROINTESTINAL: No abdominal pain, nausea, vomiting or diarrhea, melena or bright red blood per rectum. GENITOURINARY: No urinary frequency, urgency, hesitancy or dysuria. MUSCULOSKELETAL: No joint or muscle pain, no back pain, no recent trauma. DERMATOLOGIC: No rash, no itching, no lesions.  ENDOCRINE: No polyuria, polydipsia, no heat or cold intolerance. No recent change in weight. HEMATOLOGICAL: No anemia or easy bruising or bleeding. NEUROLOGIC: No headache, seizures, numbness, tingling or weakness. PSYCHIATRIC: No depression, no loss of interest in normal activity or change in sleep pattern.     Exam: chaperone present  BP 108/68 mmHg  Ht 5' 7.25" (1.708 m)  Wt 153 lb 9.6 oz (69.673 kg)  BMI 23.88 kg/m2  Body mass index is 23.88 kg/(m^2).  General appearance : Well developed well nourished female. No acute distress HEENT: Eyes: no retinal hemorrhage or exudates,  Neck supple, trachea midline, no carotid bruits, no thyroidmegaly Lungs: Clear to auscultation, no rhonchi or wheezes, or rib retractions  Heart: Regular rate and rhythm, no murmurs or gallops Breast:Examined in sitting and supine position were symmetrical in appearance, no palpable masses or tenderness,  no skin retraction, no nipple inversion, no nipple discharge, no skin discoloration, no axillary or supraclavicular lymphadenopathy Abdomen: no palpable masses or tenderness, no rebound or guarding Extremities: no edema or skin discoloration or tenderness  Pelvic:  Bartholin, Urethra, Skene Glands: Within normal limits             Vagina: No gross lesions or discharge, Atrophic changes  Cervix: No gross lesions or discharge  Uterus  , normal size, shape and consistency, non-tender and mobile  Adnexa  Without masses or tenderness  Anus and perineum  normal   Rectovaginal  normal sphincter tone without palpated masses or tenderness  Hemoccult PCP will provide     Assessment/Plan:  69 y.o. female for annual exam overdue for bone density study she will schedule it here in the office in the next few weeks. Because of her history of vitamin D deficiency and statistically significant decrease in bone mineralization from 2012 when compared with 2007 we are going to check a vitamin D level today. We  discussed importance of calcium vitamin D and weightbearing exercises for osteoporosis prevention. Pap smear not indicated according to the new guidelines.  Terrance Mass MD, 3:28 PM 12/02/2014

## 2014-12-03 LAB — VITAMIN D 25 HYDROXY (VIT D DEFICIENCY, FRACTURES): Vit D, 25-Hydroxy: 36 ng/mL (ref 30–100)

## 2015-07-27 ENCOUNTER — Encounter: Payer: Self-pay | Admitting: *Deleted

## 2015-07-28 ENCOUNTER — Encounter: Payer: Self-pay | Admitting: Cardiovascular Disease

## 2015-07-28 ENCOUNTER — Ambulatory Visit (INDEPENDENT_AMBULATORY_CARE_PROVIDER_SITE_OTHER): Payer: Medicare HMO | Admitting: Cardiovascular Disease

## 2015-07-28 ENCOUNTER — Encounter: Payer: Self-pay | Admitting: *Deleted

## 2015-07-28 VITALS — BP 104/80 | HR 79 | Ht 69.0 in | Wt 140.0 lb

## 2015-07-28 DIAGNOSIS — E785 Hyperlipidemia, unspecified: Secondary | ICD-10-CM

## 2015-07-28 DIAGNOSIS — Z136 Encounter for screening for cardiovascular disorders: Secondary | ICD-10-CM | POA: Diagnosis not present

## 2015-07-28 DIAGNOSIS — R079 Chest pain, unspecified: Secondary | ICD-10-CM

## 2015-07-28 NOTE — Patient Instructions (Signed)
Medication Instructions:  Your physician recommends that you continue on your current medications as directed. Please refer to the Current Medication list given to you today.  Labwork: NONE  Testing/Procedures: Your physician has requested that you have en exercise stress myoview. For further information please visit [email or web address]. Please follow instruction sheet, as given.  Follow-Up: Your physician recommends that you schedule a follow-up appointment in: 6 weeks.  Any Other Special Instructions Will Be Listed Below (If Applicable).  If you need a refill on your cardiac medications before your next appointment, please call your pharmacy.

## 2015-07-28 NOTE — Progress Notes (Signed)
Patient ID: Pamela Henderson, female   DOB: 14-Sep-1945, 70 y.o.   MRN: [id number]       CARDIOLOGY CONSULT NOTE  Patient ID: Pamela Henderson MRN: [id number] DOB/AGE: September 27, 1945 70 y.o.  Admit date: (Not on file) Primary Physician: Octavio Graves, DO Referring Physician:   Reason for Consultation: chest pain  HPI: The patient is a 70 year old woman she also has hyperlipidemia. He review of labs performed on 07/14/15 showed BUN 11, creatinine 0.73, triglycerides 147, total cholesterol 259, HDL 48, LDL 182. She is no longer on statin therapy. Hemoglobin 14.  ECG performed in the office today which I personally reviewed demonstrates normal sinus rhythm with LAFB and no ischemic ST segment or T-wave abnormalities, nor any arrhythmias.  Approximately 4 weeks ago she was moving a grill and developed chest tightness and felt hot, flushed, and diaphoretic with palpitations. A neighbor commented that her color did not look good. She used to eat poorly but has modified her diet and now eats a much more healthy diet. She exercises on the treadmill and on the recumbent bicycle 3 times per week at the [facility] without any difficulties. She has dizziness when she turns her head to the right. She denies syncope.   No Known Allergies  Current Outpatient Prescriptions  Medication Sig Dispense Refill  . calcium carbonate (OS-CAL) 600 MG TABS Take 600 mg by mouth 2 (two) times daily with a meal.    . levothyroxine (SYNTHROID, LEVOTHROID) 75 MCG tablet Take 75 mcg by mouth daily.    . nitroGLYCERIN (NITROSTAT) 0.4 MG SL tablet Place 0.4 mg under the tongue every 5 (five) minutes x 3 doses as needed for chest pain. If no relief after 3rd dose, proceed to the ED for an evaluation    . Omega-3 Fatty Acids (FISH OIL) 1200 MG CAPS Take 1,200 mg by mouth daily.     No current facility-administered medications for this visit.    Past Medical History  Diagnosis Date  . Hormone disorder     hypothyroidism  . Thyroid  disease     hypothyroid  . Hyperlipidemia   . Insomnia     Past Surgical History  Procedure Laterality Date  . No past surgeries      Social History   Social History  . Marital Status: Divorced    Spouse Name: N/A  . Number of Children: N/A  . Years of Education: N/A   Occupational History  . Not on file.   Social History Main Topics  . Smoking status: Never Smoker   . Smokeless tobacco: Never Used  . Alcohol Use: No  . Drug Use: No  . Sexual Activity: Not Currently   Other Topics Concern  . Not on file   Social History Narrative     No family history of premature CAD in 1st degree relatives.  Prior to Admission medications   Medication Sig Start Date End Date Taking? Authorizing Provider  calcium carbonate (OS-CAL) 600 MG TABS Take 600 mg by mouth 2 (two) times daily with a meal.   Yes Historical Provider, MD  levothyroxine (SYNTHROID, LEVOTHROID) 75 MCG tablet Take 75 mcg by mouth daily.   Yes Historical Provider, MD  nitroGLYCERIN (NITROSTAT) 0.4 MG SL tablet Place 0.4 mg under the tongue every 5 (five) minutes x 3 doses as needed for chest pain. If no relief after 3rd dose, proceed to the ED for an evaluation   Yes Historical Provider, MD  Omega-3 Fatty Acids (FISH OIL) 1200  MG CAPS Take 1,200 mg by mouth daily.   Yes Historical Provider, MD     Review of systems complete and found to be negative unless listed above in HPI     Physical exam Blood pressure 104/80, pulse 79, height 5\' 9"  (1.753 m), weight 140 lb (63.504 kg), SpO2 99 %. General: NAD Neck: No JVD, no thyromegaly or thyroid nodule.  Lungs: Clear to auscultation bilaterally with normal respiratory effort. CV: Nondisplaced PMI. Regular rate and rhythm, normal S1/S2, no S3/S4, no murmur.  No peripheral edema.  No carotid bruit.  Normal pedal pulses.  Abdomen: Soft, nontender, no hepatosplenomegaly, no distention.  Skin: Intact without lesions or rashes.  Neurologic: Alert and oriented x 3.    Psych: Normal affect. Extremities: No clubbing or cyanosis.  HEENT: Normal.   ECG: Most recent ECG reviewed.  Labs:   Lab Results  Component Value Date   WBC 8.4 02/17/2012   HGB 14.3 02/17/2012   HCT 42.8 02/17/2012   MCV 89.2 02/17/2012   PLT 291 02/17/2012   No results for input(s): NA, K, CL, CO2, BUN, CREATININE, CALCIUM, PROT, BILITOT, ALKPHOS, ALT, AST, GLUCOSE in the last 168 hours.  Invalid input(s): LABALBU No results found for: CKTOTAL, CKMB, CKMBINDEX, TROPONINI  Lab Results  Component Value Date   CHOL 286* 02/17/2012   Lab Results  Component Value Date   HDL 52 02/17/2012   Lab Results  Component Value Date   LDLCALC 212* 02/17/2012   Lab Results  Component Value Date   TRIG 109 02/17/2012   Lab Results  Component Value Date   CHOLHDL 5.5 02/17/2012   No results found for: LDLDIRECT       Studies: No results found.  ASSESSMENT AND PLAN:  1. Chest pain:Presently symptomatically stable but given hyperlipidemia and episodic chest tightness with exertion, I will proceed with an exercise Myoview to evaluate for occult ischemic heart disease.  2. Hyperlipidemia: Results reviewed above. Requires therapy. She has modified her diet.  Dispo: fu 6 weeks.   Signed: Kate Sable, M.D., F.A.C.C.  07/28/2015, 2:57 PM

## 2015-08-12 ENCOUNTER — Encounter (HOSPITAL_COMMUNITY)
Admission: RE | Admit: 2015-08-12 | Discharge: 2015-08-12 | Disposition: A | Discharge status: Home / Self Care | Payer: Medicare HMO | Source: Ambulatory Visit | Attending: Cardiovascular Disease | Admitting: Cardiovascular Disease

## 2015-08-12 ENCOUNTER — Encounter (HOSPITAL_COMMUNITY): Payer: Self-pay

## 2015-08-12 ENCOUNTER — Inpatient Hospital Stay (HOSPITAL_COMMUNITY): Admission: RE | Admit: 2015-08-12 | Payer: Medicare HMO | Source: Ambulatory Visit

## 2015-08-12 DIAGNOSIS — R079 Chest pain, unspecified: Secondary | ICD-10-CM | POA: Diagnosis not present

## 2015-08-12 LAB — NM MYOCAR MULTI W/SPECT W/WALL MOTION / EF
Estimated workload: 10.1 METS
Exercise duration (min): 8 min
Exercise duration (sec): 1 s
LV dias vol: 55 mL (ref 46–106)
LV sys vol: 11 mL
MPHR: 148 {beats}/min
Peak HR: 137 {beats}/min
Percent HR: 92 %
RATE: 0
RPE: 11
Rest HR: 61 {beats}/min
SDS: 1
SRS: 2
SSS: 3
TID: 0.93

## 2015-08-12 MED ORDER — REGADENOSON 0.4 MG/5ML IV SOLN
INTRAVENOUS | Status: AC
  Filled 2015-08-12: qty 5

## 2015-08-12 MED ORDER — SODIUM CHLORIDE 0.9% FLUSH
INTRAVENOUS | Status: AC
  Administered 2015-08-12: 10 mL via INTRAVENOUS
  Filled 2015-08-12: qty 10

## 2015-08-12 MED ORDER — TECHNETIUM TC 99M TETROFOSMIN IV KIT
10.0000 | PACK | Freq: Once | INTRAVENOUS | Status: AC | PRN
Start: 2015-08-12 — End: 2015-08-12
  Administered 2015-08-12: 10 via INTRAVENOUS

## 2015-08-12 MED ORDER — TECHNETIUM TC 99M TETROFOSMIN IV KIT
30.0000 | PACK | Freq: Once | INTRAVENOUS | Status: AC | PRN
  Administered 2015-08-12: 33 via INTRAVENOUS

## 2015-08-14 ENCOUNTER — Telehealth: Payer: Self-pay | Admitting: *Deleted

## 2015-08-14 NOTE — Telephone Encounter (Signed)
Notes Recorded by Laurine Blazer, LPN on [id number] at 075-GRM AM Left message to return call.  Notes Recorded by Arnoldo Lenis, MD on 08/13/2015 at 4:04 PM Stress test looks good, no evidence of any blockages. Dr Raliegh Ip will review in detail at their follow up

## 2015-08-17 NOTE — Telephone Encounter (Signed)
Patient returned call please call her cell phone

## 2015-08-17 NOTE — Telephone Encounter (Signed)
Notes Recorded by Laurine Blazer, LPN on [id number] at QA348G AM Patient notified. Copy to pmd. Follow up scheduled 09/07/2015.

## 2015-09-07 ENCOUNTER — Ambulatory Visit (INDEPENDENT_AMBULATORY_CARE_PROVIDER_SITE_OTHER): Payer: Medicare HMO | Admitting: Cardiovascular Disease

## 2015-09-07 VITALS — BP 133/83 | HR 69 | Ht 69.0 in | Wt 149.0 lb

## 2015-09-07 DIAGNOSIS — R079 Chest pain, unspecified: Secondary | ICD-10-CM | POA: Diagnosis not present

## 2015-09-07 DIAGNOSIS — E785 Hyperlipidemia, unspecified: Secondary | ICD-10-CM

## 2015-09-07 NOTE — Progress Notes (Signed)
      SUBJECTIVE: The patient returns for follow-up after undergoing cardiovascular testing performed for the evaluation of chest pain. Nuclear stress testing 08/12/15 was low risk with no evidence of myocardial ischemia or scar and a low risk Duke treadmill score of 8.  She thinks most of her problems were related to malpositioning of crystals inside her right ear. She recently received treatment for this.   She says she likes to eat a lot of ice cream but plans on modifying her diet further so she does not have to go on medications to reduce cholesterol.   Review of Systems: As per "subjective", otherwise negative.  No Known Allergies  Current Outpatient Prescriptions  Medication Sig Dispense Refill  . calcium carbonate (OS-CAL) 600 MG TABS Take 600 mg by mouth daily with breakfast.     . levothyroxine (SYNTHROID, LEVOTHROID) 75 MCG tablet Take 75 mcg by mouth daily.    . meclizine (ANTIVERT) 25 MG tablet Take 1 tablet by mouth 3 (three) times daily as needed.    . nitroGLYCERIN (NITROSTAT) 0.4 MG SL tablet Place 0.4 mg under the tongue every 5 (five) minutes x 3 doses as needed for chest pain. If no relief after 3rd dose, proceed to the ED for an evaluation    . Omega-3 Fatty Acids (FISH OIL) 1200 MG CAPS Take 1,200 mg by mouth daily.     No current facility-administered medications for this visit.     Past Medical History:  Diagnosis Date  . Hormone disorder    hypothyroidism  . Hyperlipidemia   . Insomnia   . Thyroid disease    hypothyroid    Past Surgical History:  Procedure Laterality Date  . NO PAST SURGERIES      Social History   Social History  . Marital status: Divorced    Spouse name: N/A  . Number of children: N/A  . Years of education: N/A   Occupational History  . Not on file.   Social History Main Topics  . Smoking status: Never Smoker  . Smokeless tobacco: Never Used  . Alcohol use No  . Drug use: No  . Sexual activity: Not Currently   Other  Topics Concern  . Not on file   Social History Narrative  . No narrative on file     Vitals:   09/07/15 1032  BP: 133/83  Pulse: 69  Weight: 149 lb (67.6 kg)  Height: 5\' 9"  (1.753 m)    PHYSICAL EXAM General: NAD HEENT: Normal. Neck: No JVD, no thyromegaly. Lungs: Clear to auscultation bilaterally with normal respiratory effort. CV: Nondisplaced PMI.  Regular rate and rhythm, normal S1/S2, no S3/S4, no murmur. No pretibial or periankle edema.    Abdomen: Soft, nontender, no distention.  Neurologic: Alert and oriented.  Psych: Normal affect. Skin: Normal. Musculoskeletal: No gross deformities.    ECG: Most recent ECG reviewed.      ASSESSMENT AND PLAN: 1. Chest pain: Normal nuclear stress test. No further testing indicated. Symptoms have resolved.  2. Hyperlipidemia: Results previously reviewed with elevated LDL 182, TC 259. Requires therapy. She has modified her diet. She says she likes to eat a lot of ice cream but plans on modifying her diet further so she does not have to go on medications to reduce cholesterol. Consider Zetia.   Dispo: fu prn.  Kate Sable, M.D., F.A.C.C.

## 2015-09-07 NOTE — Patient Instructions (Signed)
Medication Instructions:  Continue all current medications.  Labwork: None.  Testing/Procedures: None.  Follow-Up: As needed.   Any Other Special Instructions Will Be Listed Below (If Applicable).  If you need a refill on your cardiac medications before your next appointment, please call your pharmacy.

## 2015-11-20 ENCOUNTER — Other Ambulatory Visit: Payer: Self-pay | Admitting: Gynecology

## 2015-11-20 DIAGNOSIS — Z1231 Encounter for screening mammogram for malignant neoplasm of breast: Secondary | ICD-10-CM

## 2015-12-10 ENCOUNTER — Ambulatory Visit
Admission: RE | Admit: 2015-12-10 | Discharge: 2015-12-10 | Disposition: A | Discharge status: Home / Self Care | Payer: Medicare HMO | Source: Ambulatory Visit | Attending: Gynecology | Admitting: Gynecology

## 2015-12-10 DIAGNOSIS — Z1231 Encounter for screening mammogram for malignant neoplasm of breast: Secondary | ICD-10-CM

## 2016-01-29 ENCOUNTER — Encounter: Payer: Self-pay | Admitting: Gynecology

## 2016-02-04 DIAGNOSIS — Z86010 Personal history of colonic polyps: Secondary | ICD-10-CM | POA: Insufficient documentation

## 2016-04-27 ENCOUNTER — Ambulatory Visit: Payer: Medicare HMO | Admitting: Family Medicine

## 2016-05-05 ENCOUNTER — Ambulatory Visit (INDEPENDENT_AMBULATORY_CARE_PROVIDER_SITE_OTHER): Payer: Medicare HMO | Admitting: Family Medicine

## 2016-05-05 ENCOUNTER — Encounter: Payer: Self-pay | Admitting: Family Medicine

## 2016-05-05 VITALS — BP 124/77 | HR 68 | Temp 96.8°F | Ht 68.5 in | Wt 147.0 lb

## 2016-05-05 DIAGNOSIS — M8580 Other specified disorders of bone density and structure, unspecified site: Secondary | ICD-10-CM | POA: Diagnosis not present

## 2016-05-05 DIAGNOSIS — E785 Hyperlipidemia, unspecified: Secondary | ICD-10-CM | POA: Diagnosis not present

## 2016-05-05 DIAGNOSIS — E039 Hypothyroidism, unspecified: Secondary | ICD-10-CM

## 2016-05-05 DIAGNOSIS — G479 Sleep disorder, unspecified: Secondary | ICD-10-CM | POA: Diagnosis not present

## 2016-05-05 DIAGNOSIS — L659 Nonscarring hair loss, unspecified: Secondary | ICD-10-CM | POA: Diagnosis not present

## 2016-05-05 MED ORDER — TRAZODONE HCL 100 MG PO TABS: 50.0000 mg | ORAL_TABLET | Freq: Every day | ORAL | 3 refills | Status: DC

## 2016-05-05 NOTE — Progress Notes (Signed)
   HPI  Patient presents today here to establish care.  Patient has history of hyperlipidemia with difficulty tolerating statins. She does not want to try any additional statins. She has made recent changes to her diet and feels that she will seek some good improvement with this.  She also is concerned about difficulty sleeping. She's tried melatonin with no improvement. She's tried naproxen p.m. with no improvement.  She's concerned about hair thinning. She states over the last year she seems to have had an increase in hair thinning.  PMH: Insomnia, thyroid disease, hyperlipidemia Past family history: Breast cancer and MGM, lung cancer in brother and father, hypertension brother Social history: Retired, no alcohol use. ROS: Per HPI  Objective: BP 124/77   Pulse 68   Temp (!) 96.8 F (36 C) (Oral)   Ht 5' 8.5" (1.74 m)   Wt 147 lb (66.7 kg)   BMI 22.03 kg/m  Gen: NAD, alert, cooperative with exam HEENT: NCAT, EOMI, PERRL, oropharynx clear, nares clear CV: RRR, good S1/S2, no murmur Resp: CTABL, no wheezes, non-labored Abd: SNTND, BS present, no guarding or organomegaly Ext: No edema, warm Neuro: Alert and oriented, No gross deficits  Assessment and plan:  # Hyperlipidemia Severely elevated LDL, statin intolerance Consider ezetimibe Labs today Patient has made positive lifestyle choices recently  # Hypothyroidism Repeat labs Continue Synthroid  # Hair thinning Discussed supportive care including Rogaine Recommended discussion with dermatology Labs including thyroid  # Osteopenia Vitamin D level ordered.  Difficulty sleeping Trial of trazodone Patient has tried diphenhydramine and melatonin.    Orders Placed This Encounter  Procedures  . CMP14+EGFR  . CBC with Differential/Platelet  . Lipid panel  . TSH  . VITAMIN D 25 Hydroxy (Vit-D Deficiency, Fractures)    Meds ordered this encounter  Medications  . traZODone (DESYREL) 100 MG tablet    Sig:  Take 0.5-1 tablets (50-100 mg total) by mouth at bedtime.    Dispense:  30 tablet    Refill:  [place], MD [facility] 05/05/2016, 1:48 PM

## 2016-05-05 NOTE — Patient Instructions (Signed)
Great to meet you!  Lets plan to follow up in 6 month sunless you need [place] sooner.   You can try Rogaine for hair thinning, of course we will make sure there are no medical reasons for hair thinning as well.  You may consider discussing with Dr. Modena Nunnery as well.

## 2016-05-06 LAB — CBC WITH DIFFERENTIAL/PLATELET
Basophils Absolute: 0 10*3/uL (ref 0.0–0.2)
Basos: 1 %
EOS (ABSOLUTE): 0.1 10*3/uL (ref 0.0–0.4)
Eos: 1 %
Hematocrit: 42.6 % (ref 34.0–46.6)
Hemoglobin: 14.2 g/dL (ref 11.1–15.9)
Immature Grans (Abs): 0 10*3/uL (ref 0.0–0.1)
Immature Granulocytes: 0 %
Lymphocytes Absolute: 2.8 10*3/uL (ref 0.7–3.1)
Lymphs: 45 %
MCH: 29.8 pg (ref 26.6–33.0)
MCHC: 33.3 g/dL (ref 31.5–35.7)
MCV: 89 fL (ref 79–97)
Monocytes Absolute: 0.3 10*3/uL (ref 0.1–0.9)
Monocytes: 5 %
Neutrophils Absolute: 3 10*3/uL (ref 1.4–7.0)
Neutrophils: 48 %
Platelets: 287 10*3/uL (ref 150–379)
RBC: 4.77 x10E6/uL (ref 3.77–5.28)
RDW: 14.6 % (ref 12.3–15.4)
WBC: 6.2 10*3/uL (ref 3.4–10.8)

## 2016-05-06 LAB — CMP14+EGFR
ALT: 18 IU/L (ref 0–32)
AST: 24 IU/L (ref 0–40)
Albumin/Globulin Ratio: 1.9 (ref 1.2–2.2)
Albumin: 4.5 g/dL (ref 3.5–4.8)
Alkaline Phosphatase: 90 IU/L (ref 39–117)
BUN/Creatinine Ratio: 15 (ref 12–28)
BUN: 10 mg/dL (ref 8–27)
Bilirubin Total: 0.6 mg/dL (ref 0.0–1.2)
CO2: 25 mmol/L (ref 18–29)
Calcium: 9.3 mg/dL (ref 8.7–10.3)
Chloride: 104 mmol/L (ref 96–106)
Creatinine, Ser: 0.68 mg/dL (ref 0.57–1.00)
GFR calc Af Amer: 101 mL/min/{1.73_m2} (ref >59)
GFR calc non Af Amer: 88 mL/min/{1.73_m2} (ref >59)
Globulin, Total: 2.4 g/dL (ref 1.5–4.5)
Glucose: 94 mg/dL (ref 65–99)
Potassium: 4.3 mmol/L (ref 3.5–5.2)
Sodium: 142 mmol/L (ref 134–144)
Total Protein: 6.9 g/dL (ref 6.0–8.5)

## 2016-05-06 LAB — LIPID PANEL
Chol/HDL Ratio: 5.4 ratio — ABNORMAL HIGH (ref 0.0–4.4)
Cholesterol, Total: 290 mg/dL — ABNORMAL HIGH (ref 100–199)
HDL: 54 mg/dL (ref >39)
LDL Calculated: 205 mg/dL — ABNORMAL HIGH (ref 0–99)
Triglycerides: 156 mg/dL — ABNORMAL HIGH (ref 0–149)
VLDL Cholesterol Cal: 31 mg/dL (ref 5–40)

## 2016-05-06 LAB — VITAMIN D 25 HYDROXY (VIT D DEFICIENCY, FRACTURES): Vit D, 25-Hydroxy: 31.6 ng/mL (ref 30.0–100.0)

## 2016-05-06 LAB — TSH: TSH: 1.86 u[IU]/mL (ref 0.450–4.500)

## 2016-05-06 MED ORDER — LEVOTHYROXINE SODIUM 75 MCG PO TABS
75.0000 ug | ORAL_TABLET | Freq: Every day | ORAL | 3 refills | Status: DC
Start: 2016-05-06 — End: 2016-10-24

## 2016-05-06 NOTE — Addendum Note (Signed)
Addended by: Nigel Berthold C on: 05/06/2016 11:31 AM   Modules accepted: Orders

## 2016-05-24 DIAGNOSIS — L659 Nonscarring hair loss, unspecified: Secondary | ICD-10-CM | POA: Diagnosis not present

## 2016-05-24 DIAGNOSIS — L570 Actinic keratosis: Secondary | ICD-10-CM | POA: Diagnosis not present

## 2016-05-24 DIAGNOSIS — D485 Neoplasm of uncertain behavior of skin: Secondary | ICD-10-CM | POA: Diagnosis not present

## 2016-06-02 DIAGNOSIS — L659 Nonscarring hair loss, unspecified: Secondary | ICD-10-CM | POA: Diagnosis not present

## 2016-06-15 ENCOUNTER — Encounter: Payer: Self-pay | Admitting: Gynecology

## 2016-08-08 ENCOUNTER — Encounter: Payer: Self-pay | Admitting: Gynecology

## 2016-08-08 ENCOUNTER — Ambulatory Visit (INDEPENDENT_AMBULATORY_CARE_PROVIDER_SITE_OTHER): Payer: Medicare HMO | Admitting: Gynecology

## 2016-08-08 ENCOUNTER — Telehealth: Payer: Self-pay | Admitting: *Deleted

## 2016-08-08 VITALS — BP 122/78 | Ht 68.0 in | Wt 145.0 lb

## 2016-08-08 DIAGNOSIS — L659 Nonscarring hair loss, unspecified: Secondary | ICD-10-CM

## 2016-08-08 DIAGNOSIS — R8290 Unspecified abnormal findings in urine: Secondary | ICD-10-CM

## 2016-08-08 DIAGNOSIS — M6289 Other specified disorders of muscle: Secondary | ICD-10-CM

## 2016-08-08 DIAGNOSIS — E038 Other specified hypothyroidism: Secondary | ICD-10-CM

## 2016-08-08 DIAGNOSIS — M8580 Other specified disorders of bone density and structure, unspecified site: Secondary | ICD-10-CM

## 2016-08-08 DIAGNOSIS — Z01419 Encounter for gynecological examination (general) (routine) without abnormal findings: Secondary | ICD-10-CM | POA: Diagnosis not present

## 2016-08-08 LAB — TSH: TSH: 41.97 mIU/L — ABNORMAL HIGH

## 2016-08-08 LAB — CBC WITH DIFFERENTIAL/PLATELET
Basophils Absolute: 0 cells/uL (ref 0–200)
Basophils Relative: 0 %
Eosinophils Absolute: 76 cells/uL (ref 15–500)
Eosinophils Relative: 1 %
HCT: 43.4 % (ref 35.0–45.0)
Hemoglobin: 14 g/dL (ref 11.7–15.5)
Lymphocytes Relative: 43 %
Lymphs Abs: 3268 cells/uL (ref 850–3900)
MCH: 30 pg (ref 27.0–33.0)
MCHC: 32.3 g/dL (ref 32.0–36.0)
MCV: 92.9 fL (ref 80.0–100.0)
MPV: 10.7 fL (ref 7.5–12.5)
Monocytes Absolute: 380 cells/uL (ref 200–950)
Monocytes Relative: 5 %
Neutro Abs: 3876 cells/uL (ref 1500–7800)
Neutrophils Relative %: 51 %
Platelets: 271 10*3/uL (ref 140–400)
RBC: 4.67 MIL/uL (ref 3.80–5.10)
RDW: 13.9 % (ref 11.0–15.0)
WBC: 7.6 10*3/uL (ref 3.8–10.8)

## 2016-08-08 NOTE — Telephone Encounter (Signed)
-----   Message from Terrance Mass, MD sent at 08/08/2016  2:41 PM EDT ----- Anderson Malta please make appointment for this patient with Hypothyroidis with Dr. Renne Crigler

## 2016-08-08 NOTE — Telephone Encounter (Signed)
Referral placed they will contact pt to schedule. 

## 2016-08-08 NOTE — Progress Notes (Addendum)
Pamela Henderson 08-26-1945 [id number]   History:    71 y.o.  for annual gyn exam who presented to the office today with several complaints. Patient does have history of hypothyroidism and had her blood drawn by her PCP 2 months ago but she cut her medication and half because she stated she could not sleep and feels better since she did that. (Her prescription was 75 g daily). Patient had been also complaining of thinning of her hair as well. Patient also complaining of tiredness muscle fatigue and review of her record indicated she had a normal vitamin D level in April of this year with a value of 31.6. She also had notice her urine different in appearance and she states "bubbly". No dysuria or frequency or back pain or nausea or vomiting or fever chills. She had a colonoscopy in 2017 benign polyps were removed. Her last bone density study was in 2012. Patient with no past history of any abnormal Pap smears.  Bone density 2012 demonstrating the lowest T score was at the left femoral neck with a value of -2.1 and normal Frax analysis   Past medical history,surgical history, family history and social history were all reviewed and documented in the EPIC chart.  Gynecologic History No LMP recorded. Patient is postmenopausal. Contraception: post menopausal status Last Pap: Many years ago normal:  Results were: normal Last mammogram: November 2017. Results were: Normal but dense  Obstetric History OB History  Gravida Para Term Preterm AB Living  1 1       1   SAB TAB Ectopic Multiple Live Births               # Outcome Date GA Lbr Len/2nd Weight Sex Delivery Anes PTL Lv  1 Para                ROS: A ROS was performed and pertinent positives and negatives are included in the history.  GENERAL: No fevers or chills. HEENT: No change in vision, no earache, sore throat or sinus congestion. NECK: No pain or stiffness. CARDIOVASCULAR: No chest pain or pressure. No palpitations. PULMONARY: No  shortness of breath, cough or wheeze. GASTROINTESTINAL: No abdominal pain, nausea, vomiting or diarrhea, melena or bright red blood per rectum. GENITOURINARY: No urinary frequency, urgency, hesitancy or dysuria. MUSCULOSKELETAL: No joint or muscle pain, no back pain, no recent trauma. DERMATOLOGIC: No rash, no itching, no lesions. ENDOCRINE: No polyuria, polydipsia, no heat or cold intolerance. No recent change in weight. HEMATOLOGICAL: No anemia or easy bruising or bleeding. NEUROLOGIC: No headache, seizures, numbness, tingling or weakness. PSYCHIATRIC: No depression, no loss of interest in normal activity or change in sleep pattern.     Exam: chaperone present  BP 122/78   Ht 5\' 8"  (1.727 m)   Wt 145 lb (65.8 kg)   BMI 22.05 kg/m   Body mass index is 22.05 kg/m.  General appearance : Well developed well nourished female. No acute distress HEENT: Eyes: no retinal hemorrhage or exudates,  Neck supple, trachea midline, no carotid bruits, no thyroidmegaly Lungs: Clear to auscultation, no rhonchi or wheezes, or rib retractions  Heart: Regular rate and rhythm, no murmurs or gallops Breast:Examined in sitting and supine position were symmetrical in appearance, no palpable masses or tenderness,  no skin retraction, no nipple inversion, no nipple discharge, no skin discoloration, no axillary or supraclavicular lymphadenopathy Abdomen: no palpable masses or tenderness, no rebound or guarding Extremities: no edema or skin discoloration or tenderness  Pelvic:  Bartholin, Urethra, Skene Glands: Within normal limits             Vagina: No gross lesions or discharge, atrophic changes  Cervix: No gross lesions or discharge  Uterus  anteverted, normal size, shape and consistency, non-tender and mobile  Adnexa  Without masses or tenderness  Anus and perineum  normal   Rectovaginal  normal sphincter tone without palpated masses or tenderness             Hemoccult PCP provides     Assessment/Plan:   71 y.o. female for annual exam will check patient's TSH today because of her loss of hair and due to the fact that she cut her Synthroid medication have since the last time her TSH was checked. Also because of her tiredness and fatigue will check a CBC as well her vitamin D level was checked 2 months ago was normal. Her urine will be checked because of her symptoms of color difference in bubbly. Pap smear not indicated. Patient will be referred to the endocrinologist for further evaluation of her hypothyroidism as per patient's request. Pap smear not indicated. Bone density scheduled   Terrance Mass MD, 2:57 PM 08/08/2016

## 2016-08-08 NOTE — Addendum Note (Signed)
Addended by: Joaquin Music on: 08/08/2016 03:07 PM   Modules accepted: Orders

## 2016-08-09 ENCOUNTER — Other Ambulatory Visit: Payer: Self-pay | Admitting: Gynecology

## 2016-08-09 DIAGNOSIS — E039 Hypothyroidism, unspecified: Secondary | ICD-10-CM

## 2016-08-09 LAB — URINALYSIS W MICROSCOPIC + REFLEX CULTURE
Bacteria, UA: NONE SEEN [HPF]
Bilirubin Urine: NEGATIVE
Casts: NONE SEEN [LPF]
Crystals: NONE SEEN [HPF]
Glucose, UA: NEGATIVE
Hgb urine dipstick: NEGATIVE
Nitrite: NEGATIVE
Protein, ur: NEGATIVE
Specific Gravity, Urine: 1.018 (ref 1.001–1.035)
Yeast: NONE SEEN [HPF]
pH: 5.5 (ref 5.0–8.0)

## 2016-08-09 NOTE — Telephone Encounter (Signed)
Appointment on 10/18/16

## 2016-08-10 LAB — URINE CULTURE

## 2016-08-24 ENCOUNTER — Other Ambulatory Visit: Payer: Self-pay | Admitting: Gynecology

## 2016-08-24 DIAGNOSIS — M8588 Other specified disorders of bone density and structure, other site: Secondary | ICD-10-CM

## 2016-08-24 DIAGNOSIS — Z780 Asymptomatic menopausal state: Secondary | ICD-10-CM

## 2016-08-31 DIAGNOSIS — M8580 Other specified disorders of bone density and structure, unspecified site: Secondary | ICD-10-CM

## 2016-08-31 HISTORY — DX: Other specified disorders of bone density and structure, unspecified site: M85.80

## 2016-09-06 ENCOUNTER — Other Ambulatory Visit: Payer: Self-pay | Admitting: Gynecology

## 2016-09-06 ENCOUNTER — Ambulatory Visit (INDEPENDENT_AMBULATORY_CARE_PROVIDER_SITE_OTHER): Payer: Medicare HMO

## 2016-09-06 ENCOUNTER — Telehealth: Payer: Self-pay | Admitting: Gynecology

## 2016-09-06 ENCOUNTER — Encounter: Payer: Self-pay | Admitting: Gynecology

## 2016-09-06 DIAGNOSIS — M8589 Other specified disorders of bone density and structure, multiple sites: Secondary | ICD-10-CM

## 2016-09-06 DIAGNOSIS — Z780 Asymptomatic menopausal state: Secondary | ICD-10-CM | POA: Diagnosis not present

## 2016-09-06 DIAGNOSIS — M8588 Other specified disorders of bone density and structure, other site: Secondary | ICD-10-CM

## 2016-09-06 NOTE — Telephone Encounter (Signed)
Tell patient her most sent bone density shows osteopenia. It is not to the degree of osteoporosis. She does have a calculated 10 year risk of fracture elevated with an indication to discuss medication treatment. Recommend office visit to discuss treatment options. Her most recent vitamin D level was at the lowest end of normal. I would recommend supplementing vitamin D OTC 1000 units daily

## 2016-09-08 NOTE — Telephone Encounter (Signed)
Pt informed, will call to schedule OV.

## 2016-10-19 ENCOUNTER — Ambulatory Visit (INDEPENDENT_AMBULATORY_CARE_PROVIDER_SITE_OTHER): Payer: Medicare HMO | Admitting: Internal Medicine

## 2016-10-19 ENCOUNTER — Encounter: Payer: Self-pay | Admitting: Internal Medicine

## 2016-10-19 VITALS — BP 120/60 | HR 60 | Ht 68.0 in | Wt 152.0 lb

## 2016-10-19 DIAGNOSIS — E039 Hypothyroidism, unspecified: Secondary | ICD-10-CM

## 2016-10-19 DIAGNOSIS — L659 Nonscarring hair loss, unspecified: Secondary | ICD-10-CM | POA: Diagnosis not present

## 2016-10-19 NOTE — Progress Notes (Signed)
Patient ID: Pamela Henderson, female   DOB: 10-25-1945, 71 y.o.   MRN: [id number]    HPI  Pamela Henderson is a 71 y.o.-year-old female, referred by Dr. Toney Rakes, for management of hypothyroidism, dx 9s.  She describes having fairly well-controlled hypothyroidism for the last 40 years, and being on the levothyroxine 75 g daily for a long time.  However, she started to have hair tinning and hair loss (saw dermatology). She was advised by a PA sent to her house by her insurance to cut the dose of levothyroxine in half to see if this helps with her hair loss. She did this before the last TSH in 07/2016, but the next TSH was very high, at 42. At that time, her levothyroxine dose was immediately increased back to 75 g daily by Dr. Toney Rakes. She did not have repeat labs since then, pending our appointment today.  She takes  Levothyroxine 75 mcg: - fasting (6-7 am) - with water  - drinks coffee + creamer 1h later - separated by 1h from b'fast  - no iron, PPIs, multivitamins  - She takes calcium carbonate, MVI with lunch  I reviewed pt's thyroid tests: Lab Results  Component Value Date   TSH 41.97 (H) 08/08/2016   TSH 1.860 05/05/2016   TSH 1.541 02/17/2012    Pt describes that she started 03/2016: - + hair loss >> not better on B vitamins - + brain fog  - + tremors - + weight gain - overeating lately - + cold intolerance - + fatigue, + insomina - + anxiety and depression - + occas. constipation - + dry skin  Pt denies feeling nodules in neck, hoarseness, odynophagia. + SOB with lying down. + occasional dysphagia.  She has + FH of thyroid disorders in: M and daughter. No FH of thyroid cancer.  No h/o radiation tx to head or neck. No recent use of iodine supplements. She takes Hair skin and Nails and Biotin >> Started after she developed hair loss.   She does yoga weekly.  ROS: Constitutional: + see HPI Eyes: + blurry vision, no xerophthalmia ENT: no sore throat, + see  HPI Cardiovascular: no CP/SOB/+ palpitations/no leg swelling Respiratory: no cough/SOB Gastrointestinal: no N/V/D/C Musculoskeletal: no muscle/+ joint aches Skin: no rashes, + hair loss Neurological: + tremors/numbness/tingling/dizziness Psychiatric: no depression/anxiety  Past Medical History:  Diagnosis Date  . Hormone disorder    hypothyroidism  . Hyperlipidemia   . Insomnia   . Osteopenia 08/2016   T score -1.9 FRAX 22%/12%  . Thyroid disease    hypothyroid   Past Surgical History:  Procedure Laterality Date  . NO PAST SURGERIES     Social History   Social History  . Marital status: Divorced    Spouse name: N/A  . Number of children: 1 daughter   Occupational History  . retired   Social History Main Topics  . Smoking status: Never Smoker  . Smokeless tobacco: Never Used  . Alcohol use No  . Drug use: No   Current Outpatient Prescriptions on File Prior to Visit  Medication Sig Dispense Refill  . calcium carbonate (OS-CAL) 600 MG TABS Take 600 mg by mouth daily with breakfast.     . levothyroxine (SYNTHROID, LEVOTHROID) 75 MCG tablet Take 1 tablet (75 mcg total) by mouth daily. 90 tablet 3  . Omega-3 Fatty Acids (FISH OIL) 1200 MG CAPS Take 1,200 mg by mouth daily.     No current facility-administered medications on file prior to  visit.    No Known Allergies Family History  Problem Relation Age of Onset  . Cancer Father 11       lung- smoker  . Hypertension Brother   . Cancer Brother        lung  . Stroke Mother   . Breast cancer Maternal Grandmother     PE: BP 120/60 (BP Location: Left Arm, Patient Position: Sitting)   Pulse 60   Ht 5\' 8"  (1.727 m)   Wt 152 lb (68.9 kg)   BMI 23.11 kg/m  Wt Readings from Last 3 Encounters:  10/19/16 152 lb (68.9 kg)  08/08/16 145 lb (65.8 kg)  05/05/16 147 lb (66.7 kg)   Constitutional: normal weight, in NAD Eyes: PERRLA, EOMI, no exophthalmos ENT: moist mucous membranes, no thyromegaly, no cervical  lymphadenopathy Cardiovascular: RRR, No MRG Respiratory: CTA B Gastrointestinal: abdomen soft, NT, ND, BS+ Musculoskeletal: no deformities, strength intact in all 4 Skin: moist, warm, no rashes Neurological: no tremor with outstretched hands, DTR normal in all 4  ASSESSMENT: 1. Hypothyroidism  2. Hair loss  PLAN:  1. Patient with long-standing hypothyroidism, on levothyroxine therapy.She developed hair loss at the beginning of the year, but I reassured her that this was probably not related to her hypothyroidism, since a TSH obtained in 05/2016 was at goal. Unfortunately, she decreased the dose of levothyroxine in half as advised by an insurance representative and a TSH obtained 2 months ago was very high, at 65. While these can definitely cause hair loss, her symptoms started before she actually decreased the dose of levothyroxine. I advised the patient to not decrease the dose any more,unless prompted by changes in her thyroid tests. She inquires about Armour Thyroid and we discussed about benefits and side effects of the product. Since she has some tremors, palpitations, and insomnia, I did not recommend that she started Armour, which also contains the active T3, which can worsen all of the above symptoms. She understands and agrees to continue just with levothyroxine.  I also recommended to start the magnesium supplement which can help with palpitations, insomnia, and constipation. - she does not appear to have a goiter, thyroid nodules, or neck compression symptoms - We discussed about correct intake of levothyroxine, fasting, with water, separated by at least 30 minutes from breakfast, and separated by more than 4 hours from calcium, iron, multivitamins, acid reflux medications (PPIs). She is taking this correctly - will check thyroid tests  In 5 days, as she is now on biotin high dose: TSH, free T4 - If labs today are abnormal, she will need to return in ~6 weeks for repeat labs -  Otherwise, I will see her back in 6 months  2. Hair loss - please also see above. - She is not anemic, and no known history of B12 deficiency. She is currently taking hair skin and nails, which contains B12, but we discussed about checking a B12 level to see if she is absorbing the supplement or, if not, she may need IM B12  Needs refills.  Component     Latest Ref Rng & Units 10/24/2016  TSH     0.35 - 4.50 uIU/mL 2.22  T4,Free(Direct)     0.60 - 1.60 ng/dL 1.24  Vitamin B12     211 - 911 pg/mL 518  Normal TFTs and B12 level.  Philemon Kingdom, MD PhD [facility]

## 2016-10-19 NOTE — Patient Instructions (Addendum)
Please stop Biotin and come back for labs on Monday.  After labs, you can resume the Hair Skin and Nails and a lower dose of Biotin, ut always stop these 5 days before coming for thyroid labs.  Please continue Levothyroxine 75 mcg daily.  Take the thyroid hormone every day, with water, at least 30 minutes before breakfast, separated by at least 4 hours from: - acid reflux medications - calcium - iron - multivitamins  Please come back for a follow-up appointment in 6 months.

## 2016-10-24 ENCOUNTER — Other Ambulatory Visit (INDEPENDENT_AMBULATORY_CARE_PROVIDER_SITE_OTHER): Payer: Medicare HMO

## 2016-10-24 DIAGNOSIS — L659 Nonscarring hair loss, unspecified: Secondary | ICD-10-CM | POA: Diagnosis not present

## 2016-10-24 DIAGNOSIS — E039 Hypothyroidism, unspecified: Secondary | ICD-10-CM

## 2016-10-24 LAB — TSH: TSH: 2.22 u[IU]/mL (ref 0.35–4.50)

## 2016-10-24 LAB — T4, FREE: Free T4: 1.24 ng/dL (ref 0.60–1.60)

## 2016-10-24 LAB — VITAMIN B12: Vitamin B-12: 518 pg/mL (ref 211–911)

## 2016-10-24 MED ORDER — LEVOTHYROXINE SODIUM 75 MCG PO TABS: 75.0000 ug | ORAL_TABLET | Freq: Every day | ORAL | 3 refills | Status: DC

## 2016-10-25 ENCOUNTER — Telehealth: Payer: Self-pay

## 2016-10-25 NOTE — Telephone Encounter (Signed)
-----   Message from Philemon Kingdom, MD sent at 10/24/2016  5:18 PM EDT ----- Pamela Henderson, can you please call pt: TFTs now normal, as is the B12 level. Please continue current LT4 dose.

## 2016-10-25 NOTE — Telephone Encounter (Signed)
Called patient and gave lab results. Patient had no questions or concerns.  

## 2016-11-17 ENCOUNTER — Ambulatory Visit (INDEPENDENT_AMBULATORY_CARE_PROVIDER_SITE_OTHER): Payer: Medicare HMO | Admitting: *Deleted

## 2016-11-17 DIAGNOSIS — Z23 Encounter for immunization: Secondary | ICD-10-CM

## 2016-12-14 DIAGNOSIS — L570 Actinic keratosis: Secondary | ICD-10-CM | POA: Diagnosis not present

## 2016-12-28 ENCOUNTER — Other Ambulatory Visit: Payer: Self-pay | Admitting: Obstetrics & Gynecology

## 2016-12-28 DIAGNOSIS — Z1231 Encounter for screening mammogram for malignant neoplasm of breast: Secondary | ICD-10-CM

## 2017-01-17 ENCOUNTER — Ambulatory Visit: Payer: Medicare HMO | Admitting: Family Medicine

## 2017-01-17 ENCOUNTER — Encounter: Payer: Self-pay | Admitting: Family Medicine

## 2017-01-17 VITALS — BP 118/73 | HR 77 | Temp 96.7°F | Ht 68.0 in | Wt 148.2 lb

## 2017-01-17 DIAGNOSIS — J029 Acute pharyngitis, unspecified: Secondary | ICD-10-CM

## 2017-01-17 DIAGNOSIS — L723 Sebaceous cyst: Secondary | ICD-10-CM

## 2017-01-17 DIAGNOSIS — H8110 Benign paroxysmal vertigo, unspecified ear: Secondary | ICD-10-CM

## 2017-01-17 DIAGNOSIS — G44219 Episodic tension-type headache, not intractable: Secondary | ICD-10-CM | POA: Diagnosis not present

## 2017-01-17 DIAGNOSIS — H40033 Anatomical narrow angle, bilateral: Secondary | ICD-10-CM | POA: Diagnosis not present

## 2017-01-17 MED ORDER — BENZONATATE 100 MG PO CAPS: 100.0000 mg | ORAL_CAPSULE | Freq: Three times a day (TID) | ORAL | 0 refills | Status: DC | PRN

## 2017-01-17 MED ORDER — MECLIZINE HCL 25 MG PO TABS: 25.0000 mg | ORAL_TABLET | Freq: Three times a day (TID) | ORAL | 2 refills | Status: DC | PRN

## 2017-01-17 MED ORDER — FLUTICASONE PROPIONATE 50 MCG/ACT NA SUSP: 2.0000 | Freq: Every day | NASAL | 6 refills | Status: DC

## 2017-01-17 NOTE — Patient Instructions (Signed)
Great to see you! You can find meclizine at the same dose in non-drowsy dramamine.   Please let us know if your sore throat is not improving within 2-4 weeks.   Try flonase 2 sprays per nostril once daily.

## 2017-01-17 NOTE — Progress Notes (Signed)
   HPI  Patient presents today here with a cyst, sore throat, and BPPV.  Patient's had good response to meclizine previously, would like a refill. Has had more problems for the last 2 weeks, describing room spinning sensation when lying down in bed at night.  Patient also has sore throat that started after using bleach to clean the floor 2 months ago, this seems to be getting worse.  She requests Hycodan.  Right lower extremity. Patient states that she has a cyst in her right ankle.  She just noticed it after she began having unusual symptoms of her leg being asleep on the lateral portion of her right lower extremity.  She states this mostly happens when she crosses her leg and believes that this is the reason. She wanted to be sure that the cyst was not associated   PMH: Smoking status noted ROS: Per HPI  Objective: BP 118/73   Pulse 77   Temp (!) 96.7 F (35.9 C) (Oral)   Ht 5\' 8"  (1.727 m)   Wt 148 lb 3.2 oz (67.2 kg)   BMI 22.53 kg/m  Gen: NAD, alert, cooperative with exam HEENT: NCAT, oropharynx moist and clear with no pharyngeal abnormality seen, nares with swollen turbinates bilaterally and boggy mucosa CV: RRR, good S1/S2, no murmur Resp: CTABL, no wheezes, non-labored Ext: No edema, warm Neuro: Alert and oriented, No gross deficits Skin Left elbow with superficial soft mass similar to unusually shaped lipoma. Right lower extremity with firm mobile subcutaneous nodule consistent with sebaceous cyst   Assessment and plan:  #Sebaceous cyst Lower extremity, and likely to be related to her numbness of the right lower extremity. I agree with her that her right lower extremity numbness  #Sore throat Beginning with chemical exposure Tessalon Recommended ENT evaluation of persistent or does not improve, also likely some portion of postnasal drip, given Flonase  #BPPV Meclizine given, has had good response in the past, typical symptoms   Meds ordered this encounter   Medications  . meclizine (ANTIVERT) 25 MG tablet    Sig: Take 1 tablet (25 mg total) by mouth 3 (three) times daily as needed for dizziness.    Dispense:  30 tablet    Refill:  2  . benzonatate (TESSALON PERLES) 100 MG capsule    Sig: Take 1 capsule (100 mg total) by mouth 3 (three) times daily as needed for cough.    Dispense:  20 capsule    Refill:  0  . fluticasone (FLONASE) 50 MCG/ACT nasal spray    Sig: Place 2 sprays into both nostrils daily.    Dispense:  16 g    Refill:  [place], MD [facility] 01/17/2017, 1:54 PM

## 2017-02-08 ENCOUNTER — Other Ambulatory Visit: Payer: Self-pay | Admitting: *Deleted

## 2017-02-08 MED ORDER — MECLIZINE HCL 25 MG PO TABS: 25.0000 mg | ORAL_TABLET | Freq: Three times a day (TID) | ORAL | 2 refills | Status: DC | PRN

## 2017-02-27 DIAGNOSIS — Z0289 Encounter for other administrative examinations: Secondary | ICD-10-CM

## 2017-04-18 ENCOUNTER — Ambulatory Visit: Payer: Medicare HMO | Admitting: Internal Medicine

## 2017-09-06 ENCOUNTER — Ambulatory Visit (INDEPENDENT_AMBULATORY_CARE_PROVIDER_SITE_OTHER): Payer: Medicare HMO | Admitting: Pediatrics

## 2017-09-06 ENCOUNTER — Encounter: Payer: Self-pay | Admitting: Pediatrics

## 2017-09-06 VITALS — BP 121/78 | HR 75 | Temp 97.1°F | Ht 68.0 in | Wt 151.0 lb

## 2017-09-06 DIAGNOSIS — N644 Mastodynia: Secondary | ICD-10-CM | POA: Diagnosis not present

## 2017-09-06 DIAGNOSIS — E039 Hypothyroidism, unspecified: Secondary | ICD-10-CM

## 2017-09-06 MED ORDER — LEVOTHYROXINE SODIUM 75 MCG PO TABS: 75.0000 ug | ORAL_TABLET | Freq: Every day | ORAL | 3 refills | Status: DC

## 2017-09-06 NOTE — Progress Notes (Signed)
  Subjective:   Patient ID: KAMORI KITCHENS, female    DOB: Aug 10, 1945, 72 y.o.   MRN: [id number] CC: bilateral breast soreness  HPI: Pamela Henderson is a 72 y.o. female   Dog kicked her in her right breast about 3 months ago.  Since then she has had soreness but is never healed.  She can feel a lump at the top of the breast.  No redness.  No drainage.  Left breast has also been tender, mostly on the outside.  She has a history of cysts, has tenderness off and on.  No nipple discharge.  No skin changes of the breasts.  Grandmother with history of breast cancer.  Hypothyroidism: taking medicine regularly, no side effects  Feeling well, no fevers, normal appetite.  Relevant past medical, surgical, family and social history reviewed. Allergies and medications reviewed and updated. Social History   Tobacco Use  Smoking Status Never Smoker  Smokeless Tobacco Never Used   ROS: Per HPI   Objective:    BP 121/78   Pulse 75   Temp (!) 97.1 F (36.2 C) (Oral)   Ht 5\' 8"  (1.727 m)   Wt 151 lb (68.5 kg)   BMI 22.96 kg/m   Wt Readings from Last 3 Encounters:  09/06/17 151 lb (68.5 kg)  01/17/17 148 lb 3.2 oz (67.2 kg)  10/19/16 152 lb (68.9 kg)    Gen: NAD, alert, cooperative with exam, NCAT EYES: EOMI, no conjunctival injection, or no icterus CV: NRRR, normal S1/S2, no murmur, distal pulses 2+ b/l Resp: CTABL, no wheezes, normal WOB Abd: +BS, soft, NTND. no guarding or organomegaly Ext: No edema, warm Neuro: Alert and oriented, strength equal b/l UE and LE, coordination grossly normal Breast: L breast lateral soreness, 11 oclock 0.5cm tender nodule, R breast 12 oclock 1cm tender nodule.   Skin: No rash  Assessment & Plan:  Chelcee was seen today for bilateral breast soreness.  Diagnoses and all orders for this visit:  Breast tenderness in female Will order diagnostic bilateral breasts for tenderness, nodules. -     MM Digital Diagnostic Bilat; Future  Hypothyroidism, unspecified  type Stable, continue below.  Due for recheck TSH. -     TSH -     levothyroxine (SYNTHROID, LEVOTHROID) 75 MCG tablet; Take 1 tablet (75 mcg total) by mouth daily.   Follow up plan: Return in about 6 months (around 03/09/2018). Assunta Found, MD [facility]

## 2017-09-07 LAB — TSH: TSH: 4.04 u[IU]/mL (ref 0.450–4.500)

## 2017-09-09 ENCOUNTER — Other Ambulatory Visit: Payer: Self-pay | Admitting: Pediatrics

## 2017-09-09 DIAGNOSIS — N644 Mastodynia: Secondary | ICD-10-CM

## 2017-09-14 ENCOUNTER — Ambulatory Visit: Payer: Medicare HMO

## 2017-09-14 ENCOUNTER — Ambulatory Visit
Admission: RE | Admit: 2017-09-14 | Discharge: 2017-09-14 | Disposition: A | Discharge status: Home / Self Care | Payer: Medicare HMO | Source: Ambulatory Visit | Attending: Pediatrics | Admitting: Pediatrics

## 2017-09-14 DIAGNOSIS — R928 Other abnormal and inconclusive findings on diagnostic imaging of breast: Secondary | ICD-10-CM | POA: Diagnosis not present

## 2017-09-14 DIAGNOSIS — N644 Mastodynia: Secondary | ICD-10-CM

## 2017-09-25 ENCOUNTER — Ambulatory Visit: Payer: Medicare HMO | Admitting: Family Medicine

## 2017-09-25 ENCOUNTER — Encounter: Payer: Self-pay | Admitting: Family Medicine

## 2017-09-25 VITALS — BP 123/78 | HR 72 | Temp 97.4°F | Ht 68.0 in | Wt 150.0 lb

## 2017-09-25 DIAGNOSIS — Z0000 Encounter for general adult medical examination without abnormal findings: Secondary | ICD-10-CM | POA: Diagnosis not present

## 2017-09-25 NOTE — Progress Notes (Addendum)
Subjective:   Pamela Henderson is a 72 y.o. female who presents for an Initial Medicare Annual Wellness Visit.  Review of Systems    Review of Systems  Constitutional: Negative.   HENT: Negative.   Eyes: Negative.   Respiratory: Negative.   Cardiovascular: Negative.   Gastrointestinal: Negative.   Genitourinary: Negative.   Musculoskeletal: Negative.   Skin: Negative.   Neurological: Negative.   Endo/Heme/Allergies: Negative.   Psychiatric/Behavioral: Negative.     Cardiac Risk Factors include: advanced age (>54men, >74 women);dyslipidemia     Objective:    Today's Vitals   09/25/17 0807  BP: 123/78  Pulse: 72  Temp: (!) 97.4 F (36.3 C)  TempSrc: Oral  Weight: 150 lb (68 kg)  Height: 5\' 8"  (1.727 m)   Body mass index is 22.81 kg/m.  Advanced Directives 09/25/2017  Does Patient Have a Medical Advance Directive? No  Type of Advance Directive [place] in Chart? No - copy requested  Would patient like information on creating a medical advance directive? Yes (MAU/Ambulatory/Procedural Areas - Information given)    Current Medications (verified) Outpatient Encounter Medications as of 09/25/2017  Medication Sig  . calcium carbonate (OS-CAL) 600 MG TABS Take 600 mg by mouth daily with breakfast.   . levothyroxine (SYNTHROID, LEVOTHROID) 75 MCG tablet Take 1 tablet (75 mcg total) by mouth daily.  . meclizine (ANTIVERT) 25 MG tablet Take 1 tablet (25 mg total) by mouth 3 (three) times daily as needed for dizziness.  . Omega-3 Fatty Acids (FISH OIL) 1200 MG CAPS Take 1,200 mg by mouth daily.   No facility-administered encounter medications on file as of 09/25/2017.     Allergies (verified) Patient has no known allergies.   History: Past Medical History:  Diagnosis Date  . Hormone disorder    hypothyroidism  . Hyperlipidemia   . Insomnia   . Osteopenia 08/2016   T score -1.9 FRAX 22%/12%  . Thyroid disease     hypothyroid   Past Surgical History:  Procedure Laterality Date  . NO PAST SURGERIES     Family History  Problem Relation Age of Onset  . Cancer Father 90       lung- smoker  . Hypertension Brother   . Cancer Brother        lung  . Stroke Mother   . Breast cancer Maternal Grandmother    Social History   Socioeconomic History  . Marital status: Divorced    Spouse name: Not on file  . Number of children: Not on file  . Years of education: Not on file  . Highest education level: Not on file  Occupational History  . Not on file  Social Needs  . Financial resource strain: Not on file  . Food insecurity:    Worry: Not on file    Inability: Not on file  . Transportation needs:    Medical: Not on file    Non-medical: Not on file  Tobacco Use  . Smoking status: Never Smoker  . Smokeless tobacco: Never Used  Substance and Sexual Activity  . Alcohol use: No  . Drug use: No  . Sexual activity: Not Currently  Lifestyle  . Physical activity:    Days per week: Not on file    Minutes per session: Not on file  . Stress: Not on file  Relationships  . Social connections:    Talks on phone: Not on file  Gets together: Not on file    Attends religious service: Not on file    Active member of club or organization: Not on file    Attends meetings of clubs or organizations: Not on file    Relationship status: Not on file  Other Topics Concern  . Not on file  Social History Narrative  . Not on file    Tobacco Counseling Counseling given: Not Answered   Clinical Intake:     Pain : No/denies pain     Diabetes: No  How often do you need to have someone help you when you read instructions, pamphlets, or other written materials from your doctor or pharmacy?: 1 - Never What is the last grade level you completed in school?: GED, some college classes  Interpreter Needed?: No      Activities of Daily Living In your present state of health, do you have any  difficulty performing the following activities: 09/25/2017  Hearing? N  Vision? N  Difficulty concentrating or making decisions? N  Walking or climbing stairs? N  Dressing or bathing? N  Doing errands, shopping? N  Preparing Food and eating ? N  Using the Toilet? N  In the past six months, have you accidently leaked urine? N  Do you have problems with loss of bowel control? N  Managing your Medications? N  Managing your Finances? N  Housekeeping or managing your Housekeeping? N  Some recent data might be hidden     Immunizations and Health Maintenance Immunization History  Administered Date(s) Administered  . Influenza, High Dose Seasonal PF 11/17/2016   Health Maintenance Due  Topic Date Due  . Hepatitis C Screening  1945-02-21  . TETANUS/TDAP  08/01/1962  . PNA vac Low Risk Adult (1 of 2 - PCV13) 07/31/2008  . INFLUENZA VACCINE  08/31/2017    Patient Care Team: Eustaquio Maize, MD as PCP - General (Pediatrics)  Indicate any recent Medical Services you may have received from other than [facility] providers in the past year (date may be approximate).     Assessment:   This is a routine wellness examination for Pamela Henderson. BP 123/78   Pulse 72   Temp (!) 97.4 F (36.3 C) (Oral)   Ht 5\' 8"  (1.727 m)   Wt 150 lb (68 kg)   BMI 22.81 kg/m  Hearing/Vision screen No exam data present  Dietary issues and exercise activities discussed: Current Exercise Habits: Home exercise routine;Structured exercise class, Type of exercise: yoga;walking, Time (Minutes): 45, Frequency (Times/Week): 5, Weekly Exercise (Minutes/Week): 225, Intensity: Moderate, Exercise limited by: None identified  Goals    . DIET - EAT MORE FRUITS AND VEGETABLES    . DIET - REDUCE FAT INTAKE      Depression Screen PHQ 2/9 Scores 09/25/2017 09/06/2017 01/17/2017 05/05/2016  PHQ - 2 Score 0 0 0 0    Fall Risk Fall Risk  09/25/2017 09/06/2017 01/17/2017 05/05/2016  Falls in the past year? No No No Yes  Number falls in  past yr: - - - 1  Injury with Fall? - - - No    Is the patient's home free of loose throw rugs in walkways, pet beds, electrical cords, etc?   no        Grab bars in the bathroom? yes      Handrails on the stairs?   yes      Adequate lighting?   yes  Timed Get Up and Go Performed yes, passed  Cognitive Function: MMSE -  Mini Mental State Exam 09/25/2017  Orientation to time 5  Orientation to Place 5  Registration 3  Attention/ Calculation 5  Recall 3  Language- name 2 objects 2  Language- repeat 1  Language- follow 3 step command 3  Language- read & follow direction 1  Write a sentence 1  Copy design 1  Total score 30     6CIT Screen 09/25/2017  What Year? 0 points  What month? 0 points  What time? 0 points  Count back from 20 0 points  Months in reverse 0 points  Repeat phrase 0 points  Total Score 0    Screening Tests Health Maintenance  Topic Date Due  . Hepatitis C Screening  Apr 27, 1945  . TETANUS/TDAP  08/01/1962  . PNA vac Low Risk Adult (1 of 2 - PCV13) 07/31/2008  . INFLUENZA VACCINE  08/31/2017  . MAMMOGRAM  09/15/2019  . COLONOSCOPY  01/28/2026  . DEXA SCAN  Completed    Qualifies for Shingles Vaccine? Refused Cancer Screenings: Lung: Low Dose CT Chest recommended if Age 68-80 years, 30 pack-year currently smoking OR have quit w/in 15years. Patient does not qualify. Breast: Up to date on Mammogram? Yes   Up to date of Bone Density/Dexa? Yes Colorectal: Due 01/28/2026      Plan:   Continue exercising daily. Increase fruit and vegetable intake and decrease fat intake. Continue medications as prescribed.   I have personally reviewed and noted the following in the patient's chart:   . Medical and social history . Use of alcohol, tobacco or illicit drugs  . Current medications and supplements . Functional ability and status . Nutritional status . Physical activity . Advanced directives . List of other physicians . Hospitalizations, surgeries,  and ER visits in previous 12 months . Vitals . Screenings to include cognitive, depression, and falls . Referrals and appointments  In addition, I have reviewed and discussed with patient certain preventive protocols, quality metrics, and best practice recommendations. A written personalized care plan for preventive services as well as general preventive health recommendations were provided to patient.     Monia Pouch, [place]   09/25/2017

## 2017-11-06 ENCOUNTER — Ambulatory Visit (INDEPENDENT_AMBULATORY_CARE_PROVIDER_SITE_OTHER): Payer: Medicare HMO

## 2017-11-06 DIAGNOSIS — Z23 Encounter for immunization: Secondary | ICD-10-CM | POA: Diagnosis not present

## 2017-11-22 ENCOUNTER — Encounter: Payer: Self-pay | Admitting: Obstetrics & Gynecology

## 2017-11-22 ENCOUNTER — Ambulatory Visit: Payer: Medicare HMO | Admitting: Obstetrics & Gynecology

## 2017-11-22 VITALS — BP 130/78 | Ht 67.5 in | Wt 148.0 lb

## 2017-11-22 DIAGNOSIS — Z780 Asymptomatic menopausal state: Secondary | ICD-10-CM

## 2017-11-22 DIAGNOSIS — Z01419 Encounter for gynecological examination (general) (routine) without abnormal findings: Secondary | ICD-10-CM | POA: Diagnosis not present

## 2017-11-22 DIAGNOSIS — M8589 Other specified disorders of bone density and structure, multiple sites: Secondary | ICD-10-CM

## 2017-11-22 DIAGNOSIS — Z124 Encounter for screening for malignant neoplasm of cervix: Secondary | ICD-10-CM | POA: Diagnosis not present

## 2017-11-22 NOTE — Progress Notes (Signed)
Pamela Henderson 12-30-1945 [id number]   History:    72 y.o. G1P1L1 Divorced  RP:  Established patient presenting for annual gyn exam   HPI: Menopause, well on no HRT.  No PMB.  No pelvic pain.  Abstinent.  Urine normal/BMs wnl.  Breasts wnl.  BMI 22.84.  Teaches Yoga.  Health labs Fam MD.  Past medical history,surgical history, family history and social history were all reviewed and documented in the EPIC chart.  Gynecologic History No LMP recorded. Patient is postmenopausal. Contraception: abstinence and post menopausal status Last Pap: 04/2010. Results were: Negative Last mammogram: 08/2017. Results were: Negative Bone Density: 08/2016 Osteopenia Colonoscopy: 01/2016 Benign polyp  Obstetric History OB History  Gravida Para Term Preterm AB Living  1 1       1   SAB TAB Ectopic Multiple Live Births               # Outcome Date GA Lbr Len/2nd Weight Sex Delivery Anes PTL Lv  1 Para              ROS: A ROS was performed and pertinent positives and negatives are included in the history.  GENERAL: No fevers or chills. HEENT: No change in vision, no earache, sore throat or sinus congestion. NECK: No pain or stiffness. CARDIOVASCULAR: No chest pain or pressure. No palpitations. PULMONARY: No shortness of breath, cough or wheeze. GASTROINTESTINAL: No abdominal pain, nausea, vomiting or diarrhea, melena or bright red blood per rectum. GENITOURINARY: No urinary frequency, urgency, hesitancy or dysuria. MUSCULOSKELETAL: No joint or muscle pain, no back pain, no recent trauma. DERMATOLOGIC: No rash, no itching, no lesions. ENDOCRINE: No polyuria, polydipsia, no heat or cold intolerance. No recent change in weight. HEMATOLOGICAL: No anemia or easy bruising or bleeding. NEUROLOGIC: No headache, seizures, numbness, tingling or weakness. PSYCHIATRIC: No depression, no loss of interest in normal activity or change in sleep pattern.     Exam:   BP 130/78   Ht 5' 7.5" (1.715 m)   Wt 148 lb (67.1  kg)   BMI 22.84 kg/m   Body mass index is 22.84 kg/m.  General appearance : Well developed well nourished female. No acute distress HEENT: Eyes: no retinal hemorrhage or exudates,  Neck supple, trachea midline, no carotid bruits, no thyroidmegaly Lungs: Clear to auscultation, no rhonchi or wheezes, or rib retractions  Heart: Regular rate and rhythm, no murmurs or gallops Breast:Examined in sitting and supine position were symmetrical in appearance, no palpable masses or tenderness,  no skin retraction, no nipple inversion, no nipple discharge, no skin discoloration, no axillary or supraclavicular lymphadenopathy Abdomen: no palpable masses or tenderness, no rebound or guarding Extremities: no edema or skin discoloration or tenderness  Pelvic: Vulva: Normal             Vagina: No gross lesions or discharge  Cervix: No gross lesions or discharge.  Pap reflex done.  Uterus  AV, normal size, shape and consistency, non-tender and mobile  Adnexa  Without masses or tenderness  Anus: Normal   Assessment/Plan:  72 y.o. female for annual exam   1. Encounter for routine gynecological examination with Papanicolaou smear of cervix Normal gynecologic exam.  Pap reflex done.  Breast normal.  Last mammogram August 2019 was negative.  Colonoscopy December 2017 with benign polyp.  Health labs with family physician.  2. Postmenopausal Well on no hormone replacement therapy.  No postmenopausal bleeding.  3. Osteopenia of multiple sites Bone density August 2018 showing osteopenia as  multiple sites.  Vitamin D supplements, calcium intake of 1.5 g/day, regular weightbearing physical activity recommended.  Will repeat bone density in August 2020.  Princess Bruins MD, 3:03 PM 11/22/2017

## 2017-11-23 LAB — PAP IG W/ RFLX HPV ASCU

## 2017-11-26 ENCOUNTER — Encounter: Payer: Self-pay | Admitting: Obstetrics & Gynecology

## 2017-11-26 NOTE — Patient Instructions (Signed)
1. Encounter for routine gynecological examination with Papanicolaou smear of cervix Normal gynecologic exam.  Pap reflex done.  Breast normal.  Last mammogram August 2019 was negative.  Colonoscopy December 2017 with benign polyp.  Health labs with family physician.  2. Postmenopausal Well on no hormone replacement therapy.  No postmenopausal bleeding.  3. Osteopenia of multiple sites Bone density August 2018 showing osteopenia as multiple sites.  Vitamin D supplements, calcium intake of 1.5 g/day, regular weightbearing physical activity recommended.  Will repeat bone density in August 2020.  Pamela Henderson, it was a pleasure seeing you today!  I will inform you of your results as soon as they are available.

## 2017-12-13 DIAGNOSIS — L570 Actinic keratosis: Secondary | ICD-10-CM | POA: Diagnosis not present

## 2018-03-13 ENCOUNTER — Ambulatory Visit (INDEPENDENT_AMBULATORY_CARE_PROVIDER_SITE_OTHER): Payer: Medicare HMO | Admitting: Family Medicine

## 2018-03-13 ENCOUNTER — Ambulatory Visit: Payer: Medicare HMO | Admitting: Pediatrics

## 2018-03-13 VITALS — BP 108/70 | HR 78 | Temp 97.6°F | Ht 67.0 in | Wt 148.0 lb

## 2018-03-13 DIAGNOSIS — E039 Hypothyroidism, unspecified: Secondary | ICD-10-CM

## 2018-03-13 DIAGNOSIS — E782 Mixed hyperlipidemia: Secondary | ICD-10-CM | POA: Diagnosis not present

## 2018-03-13 DIAGNOSIS — F5101 Primary insomnia: Secondary | ICD-10-CM

## 2018-03-13 DIAGNOSIS — M8589 Other specified disorders of bone density and structure, multiple sites: Secondary | ICD-10-CM

## 2018-03-13 DIAGNOSIS — R251 Tremor, unspecified: Secondary | ICD-10-CM | POA: Diagnosis not present

## 2018-03-13 MED ORDER — SUVOREXANT 10 MG PO TABS: 10.0000 mg | ORAL_TABLET | Freq: Every day | ORAL | 0 refills | Status: DC

## 2018-03-13 NOTE — Progress Notes (Signed)
Subjective: CC: Hypothyrodisim PCP: Pamela Norlander, DO Pamela Henderson is a 73 y.o. female presenting to clinic today for:  1. Hypothyroidism Patient with 40+ year history of hypothyroidism.  She believes this to be an acquired hypothyroidism.  She does report family history of thyroid disorder in her mother, rheumatoid arthritis in her brother and cystic fibrosis in her granddaughter.  She reports hair thinning, insomnia and tremor.  No constipation, diarrhea, unplanned weight loss or weight gain.  No history of surgery or radiation to the neck.  She notes that she was evaluated for tremor and other neurologic abnormalities about 10 years ago and was found to have abnormalities within her MRI.  She notes that symptoms seem to spontaneously resolve at one point and she never followed up with neurology after that.  She feels like some of the symptoms are returning and is wondering if we can have her see neurology again.  She thinks that she was seen at [facility] in [place].  2.  Insomnia Patient reports longstanding history of difficulty with sleep.  She is tried multiple over-the-counter remedies including melatonin and Aleve PM.  Melatonin intermittently works.  Aleve PM works but gives her a little bit of a hangover.  She is tried multiple prescribed medications and did find good improvement with 1 of these medicines but cannot remember the name.  She discontinued it because it was too expensive.  She reports only 1 cup of coffee per morning.  No other caffeine use.  Denies any anxiety symptoms.  3.  Osteopenia Patient reports that she takes over-the-counter calcium supplements.  She is physically active and tries to maintain a balanced diet.    ROS: Per HPI  No Known Allergies Past Medical History:  Diagnosis Date  . Hormone disorder    hypothyroidism  . Hyperlipidemia   . Insomnia   . Osteopenia 08/2016   T score -1.9 FRAX 22%/12%  . Thyroid disease      hypothyroid    Current Outpatient Medications:  .  calcium carbonate (OS-CAL) 600 MG TABS, Take 600 mg by mouth daily with breakfast. , Disp: , Rfl:  .  levothyroxine (SYNTHROID, LEVOTHROID) 75 MCG tablet, Take 1 tablet (75 mcg total) by mouth daily., Disp: 90 tablet, Rfl: 3 .  Omega-3 Fatty Acids (FISH OIL) 1200 MG CAPS, Take 1,200 mg by mouth daily., Disp: , Rfl:  Social History   Socioeconomic History  . Marital status: Divorced    Spouse name: Not on file  . Number of children: Not on file  . Years of education: Not on file  . Highest education level: Not on file  Occupational History  . Not on file  Social Needs  . Financial resource strain: Not on file  . Food insecurity:    Worry: Not on file    Inability: Not on file  . Transportation needs:    Medical: Not on file    Non-medical: Not on file  Tobacco Use  . Smoking status: Never Smoker  . Smokeless tobacco: Never Used  Substance and Sexual Activity  . Alcohol use: No  . Drug use: No  . Sexual activity: Not Currently    Comment: 1st intercourse- 16, partners- 4, divorced  Lifestyle  . Physical activity:    Days per week: Not on file    Minutes per session: Not on file  . Stress: Not on file  Relationships  . Social connections:    Talks on phone: Not on  file    Gets together: Not on file    Attends religious service: Not on file    Active member of club or organization: Not on file    Attends meetings of clubs or organizations: Not on file    Relationship status: Not on file  . Intimate partner violence:    Fear of current or ex partner: Not on file    Emotionally abused: Not on file    Physically abused: Not on file    Forced sexual activity: Not on file  Other Topics Concern  . Not on file  Social History Narrative  . Not on file   Family History  Problem Relation Age of Onset  . Cancer Father 28       lung- smoker  . Hypertension Brother   . Cancer Brother        lung  . Stroke Mother   .  Breast cancer Maternal Grandmother     Objective: Office vital signs reviewed. BP 108/70   Pulse 78   Temp 97.6 F (36.4 C) (Oral)   Ht '5\' 7"'  (1.702 m)   Wt 148 lb (67.1 kg)   BMI 23.18 kg/m   Physical Examination:  General: Awake, alert, well nourished, No acute distress HEENT: Normal    Neck: No masses palpated. No lymphadenopathy; thyroid full feeling but no palpable nodules    Eyes: PERRLA, extraocular membranes intact, sclera white.  No exophthalmos Cardio: regular rate and rhythm, S1S2 heard, no murmurs appreciated Pulm: clear to auscultation bilaterally, no wheezes, rhonchi or rales; normal work of breathing on room air Skin: dry; intact; no rashes or lesions Neuro: mild tremor noted in left hand.   Assessment/ Plan: 73 y.o. female   1. Hypothyroidism, unspecified type Advised that she discontinue use of hair skin and nail vitamins for at least 1 week.  She will come in for fasting labs.  She asked that we check auto immune labs for Hashimoto's as well for informational purposes. - CBC; Future - CMP14+EGFR; Future - Thyroid Panel With TSH; Future - Thyroid Peroxidase Antibody; Future - Thyroglobulin antibody; Future  2. Tremor Unsure if this is related to thyroid versus neurologic abnormality.  I placed a referral to neurology in [facility] for further evaluation. - Ambulatory referral to Neurology  3. Primary insomnia Trial of Belsomra.  Coupon given. - Suvorexant (BELSOMRA) 10 MG TABS; Take 10 mg by mouth at bedtime.  Dispense: 10 tablet; Refill: 0  4. Mixed hyperlipidemia Come in for fasting labs.  Hyperlipidemia in the past may have been related to thyroid dysfunction - CBC; Future - CMP14+EGFR; Future - Lipid Panel; Future  5. Osteopenia of multiple sites Check vitamin D, CBC and CMP.  May need to consider updating DEXA. - CBC; Future - CMP14+EGFR; Future - VITAMIN D 25 Hydroxy (Vit-D Deficiency, Fractures); Future   Orders Placed This Encounter    Procedures  . CBC    Standing Status:   Future    Standing Expiration Date:   03/14/2019  . CMP14+EGFR    Standing Status:   Future    Standing Expiration Date:   03/14/2019  . Lipid Panel    Standing Status:   Future    Standing Expiration Date:   03/14/2019  . Thyroid Panel With TSH    Standing Status:   Future    Standing Expiration Date:   03/14/2019  . VITAMIN D 25 Hydroxy (Vit-D Deficiency, Fractures)    Standing Status:   Future  Standing Expiration Date:   03/14/2019  . Thyroid Peroxidase Antibody    Standing Status:   Future    Standing Expiration Date:   03/14/2019  . Thyroglobulin antibody    Standing Status:   Future    Standing Expiration Date:   03/14/2019  . Ambulatory referral to Neurology    Referral Priority:   Routine    Referral Type:   Consultation    Referral Reason:   Specialty Services Required    Requested Specialty:   Neurology    Number of Visits Requested:   1   No orders of the defined types were placed in this encounter.    Pamela Norlander, DO [place] [phone]

## 2018-03-13 NOTE — Patient Instructions (Signed)
Call me if you need a referral to the skin specialist in [place].  Referral for the neurologist is in  I have sent in a 10 day sample of the [place].  You were given a coupon for this as well.  Come in for fasting labs at least 1 week after stopping the Hair/skin/nails vitamins.

## 2018-03-20 ENCOUNTER — Other Ambulatory Visit: Payer: Medicare HMO

## 2018-03-20 DIAGNOSIS — E782 Mixed hyperlipidemia: Secondary | ICD-10-CM

## 2018-03-20 DIAGNOSIS — E039 Hypothyroidism, unspecified: Secondary | ICD-10-CM

## 2018-03-20 DIAGNOSIS — M8589 Other specified disorders of bone density and structure, multiple sites: Secondary | ICD-10-CM

## 2018-03-21 LAB — CMP14+EGFR
ALT: 12 IU/L (ref 0–32)
AST: 19 IU/L (ref 0–40)
Albumin/Globulin Ratio: 2 (ref 1.2–2.2)
Albumin: 4.4 g/dL (ref 3.7–4.7)
Alkaline Phosphatase: 85 IU/L (ref 39–117)
BUN/Creatinine Ratio: 14 (ref 12–28)
BUN: 11 mg/dL (ref 8–27)
Bilirubin Total: 0.5 mg/dL (ref 0.0–1.2)
CO2: 22 mmol/L (ref 20–29)
Calcium: 9.3 mg/dL (ref 8.7–10.3)
Chloride: 103 mmol/L (ref 96–106)
Creatinine, Ser: 0.76 mg/dL (ref 0.57–1.00)
GFR calc Af Amer: 89 mL/min/{1.73_m2} (ref >59)
GFR calc non Af Amer: 78 mL/min/{1.73_m2} (ref >59)
Globulin, Total: 2.2 g/dL (ref 1.5–4.5)
Glucose: 98 mg/dL (ref 65–99)
Potassium: 4.1 mmol/L (ref 3.5–5.2)
Sodium: 141 mmol/L (ref 134–144)
Total Protein: 6.6 g/dL (ref 6.0–8.5)

## 2018-03-21 LAB — CBC
Hematocrit: 40.9 % (ref 34.0–46.6)
Hemoglobin: 13.9 g/dL (ref 11.1–15.9)
MCH: 30.7 pg (ref 26.6–33.0)
MCHC: 34 g/dL (ref 31.5–35.7)
MCV: 90 fL (ref 79–97)
Platelets: 276 10*3/uL (ref 150–450)
RBC: 4.53 x10E6/uL (ref 3.77–5.28)
RDW: 12.7 % (ref 11.7–15.4)
WBC: 6.1 10*3/uL (ref 3.4–10.8)

## 2018-03-21 LAB — LIPID PANEL
Chol/HDL Ratio: 5 ratio — ABNORMAL HIGH (ref 0.0–4.4)
Cholesterol, Total: 278 mg/dL — ABNORMAL HIGH (ref 100–199)
HDL: 56 mg/dL (ref >39)
LDL Calculated: 198 mg/dL — ABNORMAL HIGH (ref 0–99)
Triglycerides: 122 mg/dL (ref 0–149)
VLDL Cholesterol Cal: 24 mg/dL (ref 5–40)

## 2018-03-21 LAB — VITAMIN D 25 HYDROXY (VIT D DEFICIENCY, FRACTURES): Vit D, 25-Hydroxy: 29.3 ng/mL — ABNORMAL LOW (ref 30.0–100.0)

## 2018-03-21 LAB — THYROGLOBULIN ANTIBODY: Thyroglobulin Antibody: <1 IU/mL (ref 0.0–0.9)

## 2018-03-21 LAB — THYROID PEROXIDASE ANTIBODY: Thyroperoxidase Ab SerPl-aCnc: 93 IU/mL — ABNORMAL HIGH (ref 0–34)

## 2018-03-21 LAB — THYROID PANEL WITH TSH
Free Thyroxine Index: 2.7 (ref 1.2–4.9)
T3 Uptake Ratio: 30 % (ref 24–39)
T4, Total: 9.1 ug/dL (ref 4.5–12.0)
TSH: 1.09 u[IU]/mL (ref 0.450–4.500)

## 2018-03-23 ENCOUNTER — Other Ambulatory Visit: Payer: Self-pay | Admitting: *Deleted

## 2018-03-23 DIAGNOSIS — E039 Hypothyroidism, unspecified: Secondary | ICD-10-CM

## 2018-03-23 MED ORDER — LEVOTHYROXINE SODIUM 75 MCG PO TABS: 75.0000 ug | ORAL_TABLET | Freq: Every day | ORAL | 3 refills | Status: DC

## 2018-03-27 DIAGNOSIS — H2513 Age-related nuclear cataract, bilateral: Secondary | ICD-10-CM | POA: Diagnosis not present

## 2018-04-05 ENCOUNTER — Encounter: Payer: Self-pay | Admitting: Family Medicine

## 2018-04-05 ENCOUNTER — Ambulatory Visit (INDEPENDENT_AMBULATORY_CARE_PROVIDER_SITE_OTHER): Payer: Medicare HMO | Admitting: Family Medicine

## 2018-04-05 VITALS — BP 116/74 | HR 76 | Temp 97.1°F | Ht 67.0 in | Wt 147.0 lb

## 2018-04-05 DIAGNOSIS — B079 Viral wart, unspecified: Secondary | ICD-10-CM

## 2018-04-05 DIAGNOSIS — B078 Other viral warts: Secondary | ICD-10-CM

## 2018-04-05 NOTE — Patient Instructions (Signed)
Cryosurgery for Skin Conditions, Care After These instructions give you information on caring for yourself after your procedure. Your doctor may also give you more specific instructions. Call your doctor if you have any problems or questions after your procedure. Follow these instructions at home: Caring for the treated area   Follow instructions from your doctor about how to take care of the treated area. Make sure you: ? Keep the area covered with a bandage (dressing) until it heals, or for as long as told by your doctor. ? Wash your hands with soap and water before you change your bandage. If you do not have soap and water, use hand sanitizer. ? Change your bandage as told by your doctor. ? Keep the bandage and the treated area clean and dry. If the bandage gets wet, change it right away. ? Clean the treated area with soap and water.  Check the treated area every day for signs of infection. Check for: ? More redness, swelling, or pain. ? More fluid or blood. ? Warmth. ? Pus or a bad smell. General instructions  Do not pick at your blister. Do not try to break it open. This can cause infection and scarring.  Do not put any medicine, cream, or lotion on the treated area unless told by your doctor.  Take over-the-counter and prescription medicines only as told by your doctor.  Keep all follow-up visits as told by your doctor. This is important. Contact a doctor if:  You have more redness, swelling, or pain around the treated area.  You have more fluid or blood coming from the treated area.  The treated area feels warm to the touch.  You have pus or a bad smell coming from the treated area.  Your blister gets large and painful. Get help right away if:  You have a fever and have redness spreading from the treated area. Summary  You should keep the treated area and your bandage clean and dry.  Check the treated area every day for signs of infection. Signs include fluid, pus,  warmth, or having more redness, swelling, or pain.  Do not pick at your blister. Do not try to break it open. This information is not intended to replace advice given to you by your health care provider. Make sure you discuss any questions you have with your health care provider. Document Released: 04/11/2011 Document Revised: 12/07/2015 Document Reviewed: 12/07/2015 Elsevier Interactive Patient Education  2019 [employer].

## 2018-04-05 NOTE — Progress Notes (Signed)
Subjective:  Patient ID: Pamela Henderson, female    DOB: Jun 09, 1945, 73 y.o.   MRN: [id number]  Chief Complaint:  Lesion on left shoulder   HPI: Pamela Henderson is a 73 y.o. female presenting on 04/05/2018 for Lesion on left shoulder  Pt presents today for evaluation of two skin lesions to her left posterior shoulder and right upper back. Pt states the lesions have been there for a while. States she has had them frozen off in the past. States the one on her left shoulder is tender and is aggravated by her clothing. Pt states the one to her right back is aggravated by her bra strap. She states she has not tried anything for the symptoms. States they are tender to touch, 6/10. No bleeding from lesions. No changes in color or size over the last few weeks.   Relevant past medical, surgical, family, and social history reviewed and updated as indicated.  Allergies and medications reviewed and updated.   Past Medical History:  Diagnosis Date  . Hormone disorder    hypothyroidism  . Hyperlipidemia   . Insomnia   . Osteopenia 08/2016   T score -1.9 FRAX 22%/12%  . Thyroid disease    hypothyroid    Past Surgical History:  Procedure Laterality Date  . NO PAST SURGERIES      Social History   Socioeconomic History  . Marital status: Divorced    Spouse name: Not on file  . Number of children: Not on file  . Years of education: Not on file  . Highest education level: Not on file  Occupational History  . Not on file  Social Needs  . Financial resource strain: Not on file  . Food insecurity:    Worry: Not on file    Inability: Not on file  . Transportation needs:    Medical: Not on file    Non-medical: Not on file  Tobacco Use  . Smoking status: Never Smoker  . Smokeless tobacco: Never Used  Substance and Sexual Activity  . Alcohol use: No  . Drug use: No  . Sexual activity: Not Currently    Comment: 1st intercourse- 16, partners- 4, divorced  Lifestyle  . Physical activity:   Days per week: Not on file    Minutes per session: Not on file  . Stress: Not on file  Relationships  . Social connections:    Talks on phone: Not on file    Gets together: Not on file    Attends religious service: Not on file    Active member of club or organization: Not on file    Attends meetings of clubs or organizations: Not on file    Relationship status: Not on file  . Intimate partner violence:    Fear of current or ex partner: Not on file    Emotionally abused: Not on file    Physically abused: Not on file    Forced sexual activity: Not on file  Other Topics Concern  . Not on file  Social History Narrative  . Not on file    Outpatient Encounter Medications as of 04/05/2018  Medication Sig  . calcium carbonate (OS-CAL) 600 MG TABS Take 600 mg by mouth daily with breakfast.   . levothyroxine (SYNTHROID, LEVOTHROID) 75 MCG tablet Take 1 tablet (75 mcg total) by mouth daily.  . Omega-3 Fatty Acids (FISH OIL) 1200 MG CAPS Take 1,200 mg by mouth daily.  . Suvorexant (BELSOMRA) 10 MG TABS  Take 10 mg by mouth at bedtime.   No facility-administered encounter medications on file as of 04/05/2018.     No Known Allergies  Review of Systems  Constitutional: Negative for chills, fatigue and fever.  Respiratory: Negative for cough and shortness of breath.   Cardiovascular: Negative for chest pain and palpitations.  Skin: Negative for color change and wound.       lesions  Neurological: Negative for dizziness, weakness and headaches.  Psychiatric/Behavioral: Negative for confusion.  All other systems reviewed and are negative.       Objective:  BP 116/74   Pulse 76   Temp (!) 97.1 F (36.2 C) (Oral)   Ht '5\' 7"'  (1.702 m)   Wt 147 lb (66.7 kg)   BMI 23.02 kg/m    Wt Readings from Last 3 Encounters:  04/05/18 147 lb (66.7 kg)  03/13/18 148 lb (67.1 kg)  11/22/17 148 lb (67.1 kg)     Physical Exam Vitals signs and nursing note reviewed.  Constitutional:       General: She is not in acute distress.    Appearance: Normal appearance. She is not ill-appearing or toxic-appearing.  HENT:     Head: Normocephalic and atraumatic.  Eyes:     Conjunctiva/sclera: Conjunctivae normal.     Pupils: Pupils are equal, round, and reactive to light.  Cardiovascular:     Rate and Rhythm: Normal rate and regular rhythm.     Heart sounds: Normal heart sounds. No murmur. No friction rub. No gallop.   Pulmonary:     Effort: Pulmonary effort is normal. No respiratory distress.     Breath sounds: Normal breath sounds.  Skin:    General: Skin is warm and dry.     Capillary Refill: Capillary refill takes less than 2 seconds.     Findings: Lesion present.       Neurological:     Mental Status: She is alert and oriented to person, place, and time.  Psychiatric:        Mood and Affect: Mood normal.        Behavior: Behavior normal. Behavior is cooperative.        Thought Content: Thought content normal.        Judgment: Judgment normal.    Procedure note  Pts name, DOB, allergies, and procedure verified. Verbal consent for procedure obtained.   Cryotherapy to left posterior shoulder lesion and right upper back lesion. 3 freeze thaw cycles completed. Pt tolerated procedure well.    Results for orders placed or performed in visit on 03/20/18  Thyroglobulin antibody  Result Value Ref Range   Thyroglobulin Antibody <1.0 0.0 - 0.9 IU/mL  Thyroid Peroxidase Antibody  Result Value Ref Range   Thyroperoxidase Ab SerPl-aCnc 93 (H) 0 - 34 IU/mL  VITAMIN D 25 Hydroxy (Vit-D Deficiency, Fractures)  Result Value Ref Range   Vit D, 25-Hydroxy 29.3 (L) 30.0 - 100.0 ng/mL  Thyroid Panel With TSH  Result Value Ref Range   TSH 1.090 0.450 - 4.500 uIU/mL   T4, Total 9.1 4.5 - 12.0 ug/dL   T3 Uptake Ratio 30 24 - 39 %   Free Thyroxine Index 2.7 1.2 - 4.9  Lipid Panel  Result Value Ref Range   Cholesterol, Total 278 (H) 100 - 199 mg/dL   Triglycerides 122 0 - 149 mg/dL    HDL 56 >39 mg/dL   VLDL Cholesterol Cal 24 5 - 40 mg/dL   LDL Calculated 198 (H) 0 - 99  mg/dL   Chol/HDL Ratio 5.0 (H) 0.0 - 4.4 ratio  CMP14+EGFR  Result Value Ref Range   Glucose 98 65 - 99 mg/dL   BUN 11 8 - 27 mg/dL   Creatinine, Ser 0.76 0.57 - 1.00 mg/dL   GFR calc non Af Amer 78 >59 mL/min/1.73   GFR calc Af Amer 89 >59 mL/min/1.73   BUN/Creatinine Ratio 14 12 - 28   Sodium 141 134 - 144 mmol/L   Potassium 4.1 3.5 - 5.2 mmol/L   Chloride 103 96 - 106 mmol/L   CO2 22 20 - 29 mmol/L   Calcium 9.3 8.7 - 10.3 mg/dL   Total Protein 6.6 6.0 - 8.5 g/dL   Albumin 4.4 3.7 - 4.7 g/dL   Globulin, Total 2.2 1.5 - 4.5 g/dL   Albumin/Globulin Ratio 2.0 1.2 - 2.2   Bilirubin Total 0.5 0.0 - 1.2 mg/dL   Alkaline Phosphatase 85 39 - 117 IU/L   AST 19 0 - 40 IU/L   ALT 12 0 - 32 IU/L  CBC  Result Value Ref Range   WBC 6.1 3.4 - 10.8 x10E3/uL   RBC 4.53 3.77 - 5.28 x10E6/uL   Hemoglobin 13.9 11.1 - 15.9 g/dL   Hematocrit 40.9 34.0 - 46.6 %   MCV 90 79 - 97 fL   MCH 30.7 26.6 - 33.0 pg   MCHC 34.0 31.5 - 35.7 g/dL   RDW 12.7 11.7 - 15.4 %   Platelets 276 150 - 450 x10E3/uL       Pertinent labs & imaging results that were available during my care of the patient were reviewed by me and considered in my medical decision making.  Assessment & Plan:  Pamela Henderson was seen today for lesion on left shoulder.  Diagnoses and all orders for this visit:  Verruca vulgaris Cryotherapy to two lesions today. Aftercare discussed. Return in 2 weeks for repeat therapy if lesions persist.     Continue all other maintenance medications.  Follow up plan: Return in about 2 weeks (around 04/19/2018), or if symptoms worsen or fail to improve, for repeat cryo .  Educational handout given for cryotherapy aftercare   The above assessment and management plan was discussed with the patient. The patient verbalized understanding of and has agreed to the management plan. Patient is aware to call the clinic if  symptoms persist or worsen. Patient is aware when to return to the clinic for a follow-up visit. Patient educated on when it is appropriate to go to the emergency department.   Monia Pouch, FNP-C [facility] [phone]

## 2018-04-19 ENCOUNTER — Ambulatory Visit: Payer: Medicare HMO | Admitting: Family Medicine

## 2018-05-03 ENCOUNTER — Telehealth: Payer: Self-pay | Admitting: Neurology

## 2018-05-03 NOTE — Telephone Encounter (Signed)
LVM regarding scheduling virtual visit for patient's 4/8 appointment. Requested patient call us back.

## 2018-05-09 ENCOUNTER — Ambulatory Visit: Payer: Medicare HMO | Admitting: Neurology

## 2018-08-13 ENCOUNTER — Ambulatory Visit (INDEPENDENT_AMBULATORY_CARE_PROVIDER_SITE_OTHER): Payer: Medicare HMO | Admitting: Family Medicine

## 2018-08-13 ENCOUNTER — Other Ambulatory Visit: Payer: Self-pay

## 2018-08-13 VITALS — BP 122/76 | HR 73 | Temp 97.4°F | Ht 67.0 in | Wt 152.0 lb

## 2018-08-13 DIAGNOSIS — M7122 Synovial cyst of popliteal space [Baker], left knee: Secondary | ICD-10-CM | POA: Diagnosis not present

## 2018-08-13 MED ORDER — NAPROXEN 500 MG PO TABS: 500.0000 mg | ORAL_TABLET | Freq: Two times a day (BID) | ORAL | 0 refills | Status: DC

## 2018-08-13 NOTE — Progress Notes (Signed)
Subjective: CC: ?baker's cyst PCP: Pamela Gouty, FNP Pamela Henderson is a 73 y.o. female presenting to clinic today for:  1. Knee concern Patient reports a greater than 1 week history of left-sided knee pain with a palpable fullness in the back of her knee.  At first she was worried about a blood clot but then she thought might be it was a cyst.  Denies any recent preceding injury.  Certainly no history of surgery to that knee.  She denies any sensation changes or weakness.  She does have some pain along the medial aspect of the knee sometimes.  Not currently taking any medications to help.  No use of ice or compression.   ROS: Per HPI  No Known Allergies Past Medical History:  Diagnosis Date  . Hormone disorder    hypothyroidism  . Hyperlipidemia   . Insomnia   . Osteopenia 08/2016   T score -1.9 FRAX 22%/12%  . Thyroid disease    hypothyroid    Current Outpatient Medications:  .  calcium carbonate (OS-CAL) 600 MG TABS, Take 600 mg by mouth daily with breakfast. , Disp: , Rfl:  .  levothyroxine (SYNTHROID, LEVOTHROID) 75 MCG tablet, Take 1 tablet (75 mcg total) by mouth daily., Disp: 90 tablet, Rfl: 3 .  Omega-3 Fatty Acids (FISH OIL) 1200 MG CAPS, Take 1,200 mg by mouth daily., Disp: , Rfl:  .  Suvorexant (BELSOMRA) 10 MG TABS, Take 10 mg by mouth at bedtime., Disp: 10 tablet, Rfl: 0 Social History   Socioeconomic History  . Marital status: Divorced    Spouse name: Not on file  . Number of children: Not on file  . Years of education: Not on file  . Highest education level: Not on file  Occupational History  . Not on file  Social Needs  . Financial resource strain: Not on file  . Food insecurity    Worry: Not on file    Inability: Not on file  . Transportation needs    Medical: Not on file    Non-medical: Not on file  Tobacco Use  . Smoking status: Never Smoker  . Smokeless tobacco: Never Used  Substance and Sexual Activity  . Alcohol use: No  . Drug use:  No  . Sexual activity: Not Currently    Comment: 1st intercourse- 16, partners- 4, divorced  Lifestyle  . Physical activity    Days per week: Not on file    Minutes per session: Not on file  . Stress: Not on file  Relationships  . Social Herbalist on phone: Not on file    Gets together: Not on file    Attends religious service: Not on file    Active member of club or organization: Not on file    Attends meetings of clubs or organizations: Not on file    Relationship status: Not on file  . Intimate partner violence    Fear of current or ex partner: Not on file    Emotionally abused: Not on file    Physically abused: Not on file    Forced sexual activity: Not on file  Other Topics Concern  . Not on file  Social History Narrative  . Not on file   Family History  Problem Relation Age of Onset  . Cancer Father 33       lung- smoker  . Hypertension Brother   . Cancer Brother        lung  .  Stroke Mother   . Breast cancer Maternal Grandmother     Objective: Office vital signs reviewed. BP 122/76   Pulse 73   Temp (!) 97.4 F (36.3 C) (Oral)   Ht 5\' 7"  (1.702 m)   Wt 152 lb (68.9 kg)   BMI 23.81 kg/m   Physical Examination:  General: Awake, alert, well nourished, No acute distress Extremities: warm, well perfused, No edema, cyanosis or clubbing; +2 pulses bilaterally MSK: normal gait and station  Left knee: no gross swelling or discoloration.  She has full AROM. There is a small palpable fullness noted along the posterior lateral aspect of the knee.  She has no ligamentous laxity. Mild TTP to medial knee noted.  No tenderness to palpation of the patella, patellar tendon or quad tendon.  Positive Thessaly on left. Neuro: 5/5 LE Strength and light touch sensation grossly intact   Assessment/ Plan: 73 y.o. female   1. Baker cyst, left Consistent with a popliteal cyst.  I have encouraged her to apply ice, avoid aggravating activities and use Naprosyn as  needed.  We discussed consideration for compression.  I did offer corticosteroid injection but she declined this today.  She will follow-up PRN - naproxen (NAPROSYN) 500 MG tablet; Take 1 tablet (500 mg total) by mouth 2 (two) times daily with a meal. (if needed for knee pain/ swelling)  Dispense: 30 tablet; Refill: 0   No orders of the defined types were placed in this encounter.  No orders of the defined types were placed in this encounter.    Pamela Norlander, DO [facility] [phone]

## 2018-08-13 NOTE — Patient Instructions (Signed)
Baker Cyst A Baker cyst, also called a popliteal cyst, is a sac-like growth that forms at the back of the knee. The cyst forms when the fluid-filled sac (bursa) that cushions the knee joint becomes enlarged. The bursa that becomes a Baker cyst is located at the back of the knee joint. What are the causes? In most cases, a Baker cyst results from another knee problem that causes swelling inside the knee. This makes the fluid inside the knee joint (synovial fluid) flow into the bursa behind the knee, causing the bursa to enlarge. What increases the risk? You may be more likely to develop a Baker cyst if you already have a knee problem, such as:  A tear in cartilage that cushions the knee joint (meniscal tear).  A tear in the tissues that connect the bones of the knee joint (ligament tear).  Knee swelling from osteoarthritis, rheumatoid arthritis, or gout. What are the signs or symptoms? A Baker cyst does not always cause symptoms. A lump behind the knee may be the only sign of the condition. The lump may be painful, especially when the knee is straightened. If the lump is painful, the pain may come and go. The knee may also be stiff. Symptoms may quickly get more severe if the cyst breaks open (ruptures). If your cyst ruptures, signs and symptoms may affect the knee and the back of the lower leg (calf) and may include:  Sudden or worsening pain.  Swelling.  Bruising. How is this diagnosed? This condition may be diagnosed based on your symptoms and medical history. Your health care provider will also do a physical exam. This may include:  Feeling the cyst to check whether it is tender.  Checking your knee for signs of another knee condition that causes swelling. You may have imaging tests, such as:  X-rays.  MRI.  Ultrasound. How is this treated? A Baker cyst that is not painful may go away without treatment. If the cyst gets large or painful, it will likely get better if the  underlying knee problem is treated. Treatment for a Baker cyst may include:  Resting.  Keeping weight off of the knee. This means not leaning on the knee to support your body weight.  NSAIDs to reduce pain and swelling.  A procedure to drain the fluid from the cyst with a needle (aspiration). You may also get an injection of a medicine that reduces swelling (steroid).  Surgery. This may be needed if other treatments do not work. This usually involves correcting knee damage and removing the cyst. Follow these instructions at home:   Take over-the-counter and prescription medicines only as told by your health care provider.  Rest and return to your normal activities as told by your health care provider. Avoid activities that make pain or swelling worse. Ask your health care provider what activities are safe for you.  Keep all follow-up visits as told by your health care provider. This is important. Contact a health care provider if:  You have knee pain, stiffness, or swelling that does not get better. Get help right away if:  You have sudden or worsening pain and swelling in your calf area. This information is not intended to replace advice given to you by your health care provider. Make sure you discuss any questions you have with your health care provider. Document Released: 01/17/2005 Document Revised: 12/30/2016 Document Reviewed: 10/08/2015 Elsevier Patient Education  2020 [employer].

## 2018-09-12 ENCOUNTER — Ambulatory Visit: Payer: Medicare HMO | Admitting: Family Medicine

## 2018-10-10 ENCOUNTER — Ambulatory Visit: Payer: Medicare HMO

## 2018-10-16 ENCOUNTER — Ambulatory Visit (INDEPENDENT_AMBULATORY_CARE_PROVIDER_SITE_OTHER): Payer: Medicare HMO | Admitting: *Deleted

## 2018-10-16 DIAGNOSIS — Z0000 Encounter for general adult medical examination without abnormal findings: Secondary | ICD-10-CM | POA: Diagnosis not present

## 2018-10-16 NOTE — Patient Instructions (Signed)
Preventive Care 75 Years and Older, Female Preventive care refers to lifestyle choices and visits with your health care provider that can promote health and wellness. This includes:  A yearly physical exam. This is also called an annual well check.  Regular dental and eye exams.  Immunizations.  Screening for certain conditions.  Healthy lifestyle choices, such as diet and exercise. What can I expect for my preventive care visit? Physical exam Your health care provider will check:  Height and weight. These may be used to calculate body mass index (BMI), which is a measurement that tells if you are at a healthy weight.  Heart rate and blood pressure.  Your skin for abnormal spots. Counseling Your health care provider may ask you questions about:  Alcohol, tobacco, and drug use.  Emotional well-being.  Home and relationship well-being.  Sexual activity.  Eating habits.  History of falls.  Memory and ability to understand (cognition).  Work and work Statistician.  Pregnancy and menstrual history. What immunizations do I need?  Influenza (flu) vaccine  This is recommended every year. Tetanus, diphtheria, and pertussis (Tdap) vaccine  You may need a Td booster every 10 years. Varicella (chickenpox) vaccine  You may need this vaccine if you have not already been vaccinated. Zoster (shingles) vaccine  You may need this after age 75. Pneumococcal conjugate (PCV13) vaccine  One dose is recommended after age 75. Pneumococcal polysaccharide (PPSV23) vaccine  One dose is recommended after age 75. Measles, mumps, and rubella (MMR) vaccine  You may need at least one dose of MMR if you were born in 1957 or later. You may also need a second dose. Meningococcal conjugate (MenACWY) vaccine  You may need this if you have certain conditions. Hepatitis A vaccine  You may need this if you have certain conditions or if you travel or work in places where you may be exposed  to hepatitis A. Hepatitis B vaccine  You may need this if you have certain conditions or if you travel or work in places where you may be exposed to hepatitis B. Haemophilus influenzae type b (Hib) vaccine  You may need this if you have certain conditions. You may receive vaccines as individual doses or as more than one vaccine together in one shot (combination vaccines). Talk with your health care provider about the risks and benefits of combination vaccines. What tests do I need? Blood tests  Lipid and cholesterol levels. These may be checked every 5 years, or more frequently depending on your overall health.  Hepatitis C test.  Hepatitis B test. Screening  Lung cancer screening. You may have this screening every year starting at age 75 if you have a 30-pack-year history of smoking and currently smoke or have quit within the past 15 years.  Colorectal cancer screening. All adults should have this screening starting at age 75 and continuing until age 15. Your health care provider may recommend screening at age 23 if you are at increased risk. You will have tests every 1-10 years, depending on your results and the type of screening test.  Diabetes screening. This is done by checking your blood sugar (glucose) after you have not eaten for a while (fasting). You may have this done every 1-3 years.  Mammogram. This may be done every 1-2 years. Talk with your health care provider about how often you should have regular mammograms.  BRCA-related cancer screening. This may be done if you have a family history of breast, ovarian, tubal, or peritoneal cancers.  Other tests  Sexually transmitted disease (STD) testing.  Bone density scan. This is done to screen for osteoporosis. You may have this done starting at age 75. Follow these instructions at home: Eating and drinking  Eat a diet that includes fresh fruits and vegetables, whole grains, lean protein, and low-fat dairy products. Limit  your intake of foods with high amounts of sugar, saturated fats, and salt.  Take vitamin and mineral supplements as recommended by your health care provider.  Do not drink alcohol if your health care provider tells you not to drink.  If you drink alcohol: ? Limit how much you have to 0-1 drink a day. ? Be aware of how much alcohol is in your drink. In the [place] one drink equals one 12 oz bottle of beer (355 mL), one 5 oz glass of wine (148 mL), or one 1 oz glass of hard liquor (44 mL). Lifestyle  Take daily care of your teeth and gums.  Stay active. Exercise for at least 30 minutes on 5 or more days each week.  Do not use any products that contain nicotine or tobacco, such as cigarettes, e-cigarettes, and chewing tobacco. If you need help quitting, ask your health care provider.  If you are sexually active, practice safe sex. Use a condom or other form of protection in order to prevent STIs (sexually transmitted infections).  Talk with your health care provider about taking a low-dose aspirin or statin. What's next?  Go to your health care provider once a year for a well check visit.  Ask your health care provider how often you should have your eyes and teeth checked.  Stay up to date on all vaccines. This information is not intended to replace advice given to you by your health care provider. Make sure you discuss any questions you have with your health care provider. Document Released: 02/13/2015 Document Revised: 01/11/2018 Document Reviewed: 01/11/2018 Elsevier Patient Education  2020 [employer].

## 2018-10-16 NOTE — Progress Notes (Signed)
MEDICARE ANNUAL WELLNESS VISIT  10/16/2018  Telephone Visit Disclaimer This Medicare AWV was conducted by telephone due to national recommendations for restrictions regarding the COVID-19 Pandemic (e.g. social distancing).  I verified, using two identifiers, that I am speaking with Pamela Henderson or their authorized healthcare agent. I discussed the limitations, risks, security, and privacy concerns of performing an evaluation and management service by telephone and the potential availability of an in-person appointment in the future. The patient expressed understanding and agreed to proceed.   Subjective:  Pamela Henderson is a 73 y.o. female patient of Rakes, Connye Burkitt, FNP who had a Medicare Annual Wellness Visit today via telephone. Pamela Henderson is Retired and lives alone. she has 1 child. she reports that she is socially active and does interact with friends/family regularly. she is moderately physically active and enjoys yard work, feeding the birds and squirrels.  Patient Care Team: Baruch Gouty, FNP as PCP - General (Family Medicine)  Advanced Directives 10/16/2018 09/25/2017  Does Patient Have a Medical Advance Directive? No No  Type of Advance Directive - [place] in Chart? - No - copy requested  Would patient like information on creating a medical advance directive? No - Patient declined Yes (MAU/Ambulatory/Procedural Areas - Information given)    Hospital Utilization Over the Past 12 Months: # of hospitalizations or ER visits: 0 # of surgeries: 0  Review of Systems    Patient reports that her overall health is unchanged compared to last year.  History obtained from chart review  Patient Reported Readings (BP, Pulse, CBG, Weight, etc) none  Pain Assessment Pain : No/denies pain     Current Medications & Allergies (verified) Allergies as of 10/16/2018   No Known Allergies     Medication List       Accurate as of  October 16, 2018  8:50 AM. If you have any questions, ask your nurse or doctor.        STOP taking these medications   naproxen 500 MG tablet Commonly known as: Naprosyn     TAKE these medications   calcium carbonate 600 MG Tabs tablet Commonly known as: OS-CAL Take 600 mg by mouth daily with breakfast.   Fish Oil 1200 MG Caps Take 1,200 mg by mouth daily.   levothyroxine 75 MCG tablet Commonly known as: SYNTHROID Take 1 tablet (75 mcg total) by mouth daily.       History (reviewed): Past Medical History:  Diagnosis Date  . Hormone disorder    hypothyroidism  . Hyperlipidemia   . Insomnia   . Osteopenia 08/2016   T score -1.9 FRAX 22%/12%  . Thyroid disease    hypothyroid   Past Surgical History:  Procedure Laterality Date  . NO PAST SURGERIES     Family History  Problem Relation Age of Onset  . Cancer Father 57       lung- smoker  . Hypertension Brother   . Cancer Brother        lung  . Stroke Mother   . Breast cancer Maternal Grandmother    Social History   Socioeconomic History  . Marital status: Divorced    Spouse name: Not on file  . Number of children: 1  . Years of education: 10th grade-then got her GED  . Highest education level: GED or equivalent  Occupational History  . Not on file  Social Needs  . Financial resource strain: Not  hard at all  . Food insecurity    Worry: Never true    Inability: Never true  . Transportation needs    Medical: No    Non-medical: No  Tobacco Use  . Smoking status: Never Smoker  . Smokeless tobacco: Never Used  Substance and Sexual Activity  . Alcohol use: No  . Drug use: No  . Sexual activity: Not Currently    Birth control/protection: None    Comment: 1st intercourse- 16, partners- 4, divorced  Lifestyle  . Physical activity    Days per week: 7 days    Minutes per session: 30 min  . Stress: Not at all  Relationships  . Social connections    Talks on phone: More than three times a week     Gets together: More than three times a week    Attends religious service: More than 4 times per year    Active member of club or organization: Yes    Attends meetings of clubs or organizations: More than 4 times per year    Relationship status: Divorced  Other Topics Concern  . Not on file  Social History Narrative  . Not on file    Activities of Daily Living In your present state of health, do you have any difficulty performing the following activities: 10/16/2018  Hearing? N  Vision? N  Difficulty concentrating or making decisions? N  Walking or climbing stairs? N  Dressing or bathing? N  Doing errands, shopping? N  Preparing Food and eating ? N  Using the Toilet? N  In the past six months, have you accidently leaked urine? N  Do you have problems with loss of bowel control? N  Managing your Medications? N  Managing your Finances? N  Housekeeping or managing your Housekeeping? N  Some recent data might be hidden    Patient Education/ Literacy How often do you need to have someone help you when you read instructions, pamphlets, or other written materials from your doctor or pharmacy?: 1 - Never What is the last grade level you completed in school?: 10th grade-then got her GED  Exercise Current Exercise Habits: Home exercise routine, Type of exercise: walking;yoga, Time (Minutes): 30, Frequency (Times/Week): 7, Weekly Exercise (Minutes/Week): 210, Intensity: Moderate, Exercise limited by: None identified  Diet Patient reports consuming 2 meals a day and 2 snack(s) a day Patient reports that her primary diet is: Regular Patient reports that she does have regular access to food.   Depression Screen PHQ 2/9 Scores 10/16/2018 08/13/2018 04/05/2018 03/13/2018 09/25/2017 09/06/2017 01/17/2017  PHQ - 2 Score 0 0 0 0 0 0 0  PHQ- 9 Score - 0 - 0 - - -     Fall Risk Fall Risk  10/16/2018 08/13/2018 03/13/2018 09/25/2017 09/06/2017  Falls in the past year? 0 0 0 No No  Number falls in past  yr: 0 - - - -  Injury with Fall? 0 - - - -  Follow up Falls prevention discussed - - - -  Comment get rid of all throw rugs in the house, adequate lighting in the walkway and grab bars in the bathroom - - - -     Objective:  Pamela Henderson seemed alert and oriented and she participated appropriately during our telephone visit.  Blood Pressure Weight BMI  BP Readings from Last 3 Encounters:  08/13/18 122/76  04/05/18 116/74  03/13/18 108/70   Wt Readings from Last 3 Encounters:  08/13/18 152 lb (68.9 kg)  04/05/18 147 lb (66.7 kg)  03/13/18 148 lb (67.1 kg)   BMI Readings from Last 1 Encounters:  08/13/18 23.81 kg/m    *Unable to obtain current vital signs, weight, and BMI due to telephone visit type  Hearing/Vision  . Pamela Henderson did not seem to have difficulty with hearing/understanding during the telephone conversation . Reports that she has not had a formal eye exam by an eye care professional within the past year . Reports that she has not had a formal hearing evaluation within the past year *Unable to fully assess hearing and vision during telephone visit type  Cognitive Function: 6CIT Screen 10/16/2018 09/25/2017  What Year? 0 points 0 points  What month? 0 points 0 points  What time? 0 points 0 points  Count back from 20 0 points 0 points  Months in reverse 0 points 0 points  Repeat phrase 0 points 0 points  Total Score 0 0   (Normal:0-7, Significant for Dysfunction: >8)  Normal Cognitive Function Screening: Yes   Immunization & Health Maintenance Record Immunization History  Administered Date(s) Administered  . Influenza, High Dose Seasonal PF 11/17/2016, 11/06/2017    Health Maintenance  Topic Date Due  . Hepatitis C Screening  [birth year]  . TETANUS/TDAP  08/01/1962  . PNA vac Low Risk Adult (1 of 2 - PCV13) 07/31/2008  . INFLUENZA VACCINE  09/01/2018  . COLONOSCOPY  01/28/2026  . DEXA SCAN  Completed       Assessment  This is a routine wellness  examination for Pamela Henderson.  Health Maintenance: Due or Overdue Health Maintenance Due  Topic Date Due  . Hepatitis C Screening  [birth year]  . TETANUS/TDAP  08/01/1962  . PNA vac Low Risk Adult (1 of 2 - PCV13) 07/31/2008  . INFLUENZA VACCINE  09/01/2018    Pamela Henderson does not need a referral for [employer] Assistance: Care Management:   no Social Work:    no Prescription Assistance:  no Nutrition/Diabetes Education:  no   Plan:  Personalized Goals Goals Addressed            This Visit's Progress   . DIET - INCREASE WATER INTAKE       Try to drink 6-8 glasses of water daily.      Personalized Health Maintenance & Screening Recommendations  Influenza vaccine pt declines Prevnar and TDAP  Lung Cancer Screening Recommended: no (Low Dose CT Chest recommended if Age 73-80 years, 30 pack-year currently smoking OR have quit w/in past 15 years) Hepatitis C Screening recommended: no HIV Screening recommended: no  Advanced Directives: Written information was not prepared per patient's request.  Referrals & Orders No orders of the defined types were placed in this encounter.   Follow-up Plan . Follow-up with Baruch Gouty, FNP as planned . Get your Flu vaccine at your next visit with your PCP   I have personally reviewed and noted the following in the patient's chart:   . Medical and social history . Use of alcohol, tobacco or illicit drugs  . Current medications and supplements . Functional ability and status . Nutritional status . Physical activity . Advanced directives . List of other physicians . Hospitalizations, surgeries, and ER visits in previous 12 months . Vitals . Screenings to include cognitive, depression, and falls . Referrals and appointments  In addition, I have reviewed and discussed with Pamela Henderson certain preventive protocols, quality metrics, and best practice recommendations. A written personalized care plan for preventive services as  well as general preventive health recommendations is available and can be mailed to the patient at her request.      Marylin Crosby, LPN  [id number]

## 2018-10-25 ENCOUNTER — Ambulatory Visit (INDEPENDENT_AMBULATORY_CARE_PROVIDER_SITE_OTHER): Payer: Medicare HMO | Admitting: Nurse Practitioner

## 2018-10-25 ENCOUNTER — Other Ambulatory Visit: Payer: Self-pay

## 2018-10-25 ENCOUNTER — Encounter: Payer: Self-pay | Admitting: Nurse Practitioner

## 2018-10-25 DIAGNOSIS — W57XXXA Bitten or stung by nonvenomous insect and other nonvenomous arthropods, initial encounter: Secondary | ICD-10-CM

## 2018-10-25 DIAGNOSIS — S90562A Insect bite (nonvenomous), left ankle, initial encounter: Secondary | ICD-10-CM

## 2018-10-25 MED ORDER — CIPROFLOXACIN HCL 500 MG PO TABS: 500.0000 mg | ORAL_TABLET | Freq: Two times a day (BID) | ORAL | 0 refills | Status: DC

## 2018-10-25 NOTE — Progress Notes (Signed)
Virtual Visit via telephone Note Due to COVID-19 pandemic this visit was conducted virtually. This visit type was conducted due to national recommendations for restrictions regarding the COVID-19 Pandemic (e.g. social distancing, sheltering in place) in an effort to limit this patient's exposure and mitigate transmission in our community. All issues noted in this document were discussed and addressed.  A physical exam was not performed with this format.  I connected with Pamela Henderson on 10/25/18 at 4:00 by telephone and verified that I am speaking with the correct person using two identifiers. Pamela Henderson is currently located at home and no one is currently with her during visit. The provider, Mary-Margaret Hassell Done, FNP is located in their office at time of visit.  I discussed the limitations, risks, security and privacy concerns of performing an evaluation and management service by telephone and the availability of in person appointments. I also discussed with the patient that there may be a patient responsible charge related to this service. The patient expressed understanding and agreed to proceed.   History and Present Illness:   Chief Complaint: Insect Bite   HPI Patient has been working in her yard and pulled up some tall weeds and now she has a rash on legs near ankle. Does not itch but has hard knot. She thinks it ois an insect bite. No drainage    Review of Systems  Constitutional: Negative for diaphoresis and weight loss.  Eyes: Negative for blurred vision, double vision and pain.  Respiratory: Negative for shortness of breath.   Cardiovascular: Negative for chest pain, palpitations, orthopnea and leg swelling.  Gastrointestinal: Negative for abdominal pain.  Skin: Negative for rash.  Neurological: Negative for dizziness, sensory change, loss of consciousness, weakness and headaches.  Endo/Heme/Allergies: Negative for polydipsia. Does not bruise/bleed easily.   Psychiatric/Behavioral: Negative for memory loss. The patient does not have insomnia.   All other systems reviewed and are negative.    Observations/Objective: Alert and oriented- answers all questions appropriately No distress Erythematous knot on left ankle  Assessment and Plan: Pamela Henderson in today with chief complaint of Insect Bite   1. Insect bite of left ankle, initial encounter Meds ordered this encounter  Medications  . ciprofloxacin (CIPRO) 500 MG tablet    Sig: Take 1 tablet (500 mg total) by mouth 2 (two) times daily.    Dispense:  20 tablet    Refill:  0    Order Specific Question:   Supervising Provider    Answer:   Caryl Pina A [id number]   Do not pick or scratch at area   Follow Up Instructions: prn    I discussed the assessment and treatment plan with the patient. The patient was provided an opportunity to ask questions and all were answered. The patient agreed with the plan and demonstrated an understanding of the instructions.   The patient was advised to call back or seek an in-person evaluation if the symptoms worsen or if the condition fails to improve as anticipated.  The above assessment and management plan was discussed with the patient. The patient verbalized understanding of and has agreed to the management plan. Patient is aware to call the clinic if symptoms persist or worsen. Patient is aware when to return to the clinic for a follow-up visit. Patient educated on when it is appropriate to go to the emergency department.   Time call ended:   4:12 I provided 12 minutes of non-face-to-face time during this encounter.  Mary-Margaret Rapheal Masso, FNP   

## 2018-11-08 ENCOUNTER — Encounter: Payer: Self-pay | Admitting: Gynecology

## 2018-11-25 DIAGNOSIS — W5501XA Bitten by cat, initial encounter: Secondary | ICD-10-CM | POA: Diagnosis not present

## 2018-11-25 DIAGNOSIS — M79631 Pain in right forearm: Secondary | ICD-10-CM | POA: Diagnosis not present

## 2018-11-25 DIAGNOSIS — Z7189 Other specified counseling: Secondary | ICD-10-CM | POA: Diagnosis not present

## 2018-11-26 ENCOUNTER — Telehealth: Payer: Self-pay | Admitting: Family Medicine

## 2018-11-26 NOTE — Telephone Encounter (Signed)
No documentation in epic or [facility] of a tetanus left patient detailed message.

## 2018-11-27 ENCOUNTER — Other Ambulatory Visit: Payer: Self-pay

## 2018-11-28 ENCOUNTER — Ambulatory Visit (INDEPENDENT_AMBULATORY_CARE_PROVIDER_SITE_OTHER): Payer: Medicare HMO | Admitting: Obstetrics & Gynecology

## 2018-11-28 ENCOUNTER — Encounter: Payer: Self-pay | Admitting: Obstetrics & Gynecology

## 2018-11-28 VITALS — BP 140/78 | Ht 67.0 in | Wt 146.0 lb

## 2018-11-28 DIAGNOSIS — N644 Mastodynia: Secondary | ICD-10-CM | POA: Diagnosis not present

## 2018-11-28 DIAGNOSIS — Z01419 Encounter for gynecological examination (general) (routine) without abnormal findings: Secondary | ICD-10-CM

## 2018-11-28 DIAGNOSIS — Z780 Asymptomatic menopausal state: Secondary | ICD-10-CM

## 2018-11-28 DIAGNOSIS — M8589 Other specified disorders of bone density and structure, multiple sites: Secondary | ICD-10-CM

## 2018-11-28 DIAGNOSIS — W5501XA Bitten by cat, initial encounter: Secondary | ICD-10-CM | POA: Diagnosis not present

## 2018-11-28 DIAGNOSIS — H0016 Chalazion left eye, unspecified eyelid: Secondary | ICD-10-CM | POA: Diagnosis not present

## 2018-11-28 NOTE — Progress Notes (Signed)
Pamela Henderson 09/15/1945 [id number]   History:    73 y.o. G1P1L1 Divorced.  RP:  Established patient presenting for annual gyn exam   HPI: Postmenopause, well on no HRT.  No PMB.  No pelvic pain.  Abstinent x many years.  Lt breast normal.  Rt breast tenderness at 7 O'Clock.  BMI 22.87.  Yoga and walking.  Health labs with family physician.  Past medical history,surgical history, family history and social history were all reviewed and documented in the EPIC chart.  Gynecologic History No LMP recorded. Patient is postmenopausal. Contraception: abstinence and post menopausal status Last Pap: 10/2017. Results were: Negative Last mammogram: 08/2017. Results were: Negative Bone Density: 08/2016 Osteopenia LFN -1.9 Colonoscopy: 2017  Obstetric History OB History  Gravida Para Term Preterm AB Living  1 1       1   SAB TAB Ectopic Multiple Live Births               # Outcome Date GA Lbr Len/2nd Weight Sex Delivery Anes PTL Lv  1 Para              ROS: A ROS was performed and pertinent positives and negatives are included in the history.  GENERAL: No fevers or chills. HEENT: No change in vision, no earache, sore throat or sinus congestion. NECK: No pain or stiffness. CARDIOVASCULAR: No chest pain or pressure. No palpitations. PULMONARY: No shortness of breath, cough or wheeze. GASTROINTESTINAL: No abdominal pain, nausea, vomiting or diarrhea, melena or bright red blood per rectum. GENITOURINARY: No urinary frequency, urgency, hesitancy or dysuria. MUSCULOSKELETAL: No joint or muscle pain, no back pain, no recent trauma. DERMATOLOGIC: No rash, no itching, no lesions. ENDOCRINE: No polyuria, polydipsia, no heat or cold intolerance. No recent change in weight. HEMATOLOGICAL: No anemia or easy bruising or bleeding. NEUROLOGIC: No headache, seizures, numbness, tingling or weakness. PSYCHIATRIC: No depression, no loss of interest in normal activity or change in sleep pattern.     Exam:   BP  140/78   Ht 5\' 7"  (1.702 m)   Wt 146 lb (66.2 kg)   BMI 22.87 kg/m   Body mass index is 22.87 kg/m.  General appearance : Well developed well nourished female. No acute distress HEENT: Eyes: no retinal hemorrhage or exudates,  Neck supple, trachea midline, no carotid bruits, no thyroidmegaly Lungs: Clear to auscultation, no rhonchi or wheezes, or rib retractions  Heart: Regular rate and rhythm, no murmurs or gallops Breast:Examined in sitting and supine position were symmetrical in appearance, no palpable masses or tenderness on the left, right breast tenderness at 7:00,  no skin retraction, no nipple inversion, no nipple discharge, no skin discoloration, no axillary or supraclavicular lymphadenopathy Abdomen: no palpable masses or tenderness, no rebound or guarding Extremities: no edema or skin discoloration or tenderness  Pelvic: Vulva: Normal             Vagina: No gross lesions or discharge  Cervix: No gross lesions or discharge  Uterus  AV, normal size, shape and consistency, non-tender and mobile  Adnexa  Without masses or tenderness  Anus: Normal   Assessment/Plan:  73 y.o. female for annual exam   1. Well female exam with routine gynecological exam Gynecologic exam normal in menopause.  Pap test in October 2019 was negative, no indication to repeat this year.  Breast exam normal.  Screening mammogram August 2019 was negative, will repeat a screening mammogram now on the left side and we will organize a right  diagnostic mammogram and ultrasound.  Health labs with family physician.  Good body mass index at 22.87.  Continue with fitness and healthy nutrition.  2. Postmenopausal Well on no hormone replacement therapy.  No postmenopausal bleeding.  3. Osteopenia of multiple sites Osteopenia on bone density August 2018.  Will repeat a bone density now.  Vitamin D supplements, calcium intake of 1200 mg daily and regular weightbearing physical activity is recommended. - DG Bone  Density; Future  4. Pain of right breast Right breast tenderness at 7:00.  Schedule a right diagnostic mammogram and ultrasound.  Other orders - amoxicillin (AMOXIL) 250 MG capsule; Take 250 mg by mouth 3 (three) times daily. - Multiple Vitamin (MULTIVITAMIN) tablet; Take 1 tablet by mouth daily.  Counseling on above issues and coordination of care >50% x 15 minutes.  Princess Bruins MD, 9:26 AM 11/28/2018

## 2018-11-29 ENCOUNTER — Telehealth: Payer: Self-pay | Admitting: *Deleted

## 2018-11-29 DIAGNOSIS — N644 Mastodynia: Secondary | ICD-10-CM

## 2018-11-29 NOTE — Telephone Encounter (Signed)
Per Dr. Dellis Filbert staff message "Due for mammo. Rt breast tenderness at 7 O'Clock. " order placed at breast center

## 2018-11-29 NOTE — Telephone Encounter (Signed)
Breast center left message for patient to call and schedule.

## 2018-12-03 ENCOUNTER — Encounter: Payer: Self-pay | Admitting: Obstetrics & Gynecology

## 2018-12-03 NOTE — Patient Instructions (Signed)
  1. Well female exam with routine gynecological exam Gynecologic exam normal in menopause.  Pap test in October 2019 was negative, no indication to repeat this year.  Breast exam normal.  Screening mammogram August 2019 was negative, will repeat a screening mammogram now on the left side and we will organize a right diagnostic mammogram and ultrasound.  Health labs with family physician.  Good body mass index at 22.87.  Continue with fitness and healthy nutrition.  2. Postmenopausal Well on no hormone replacement therapy.  No postmenopausal bleeding.  3. Osteopenia of multiple sites Osteopenia on bone density August 2018.  Will repeat a bone density now.  Vitamin D supplements, calcium intake of 1200 mg daily and regular weightbearing physical activity is recommended. - DG Bone Density; Future  4. Pain of right breast Right breast tenderness at 7:00.  Schedule a right diagnostic mammogram and ultrasound.  Other orders - amoxicillin (AMOXIL) 250 MG capsule; Take 250 mg by mouth 3 (three) times daily. - Multiple Vitamin (MULTIVITAMIN) tablet; Take 1 tablet by mouth daily.  Svea, it was a pleasure seeing you today!

## 2018-12-03 NOTE — Telephone Encounter (Signed)
Patient scheduled at 12/12/18 @ 9:40am

## 2018-12-11 DIAGNOSIS — H0015 Chalazion left lower eyelid: Secondary | ICD-10-CM | POA: Diagnosis not present

## 2018-12-11 DIAGNOSIS — H0102B Squamous blepharitis left eye, upper and lower eyelids: Secondary | ICD-10-CM | POA: Diagnosis not present

## 2018-12-11 DIAGNOSIS — H0102A Squamous blepharitis right eye, upper and lower eyelids: Secondary | ICD-10-CM | POA: Diagnosis not present

## 2018-12-11 DIAGNOSIS — H2513 Age-related nuclear cataract, bilateral: Secondary | ICD-10-CM | POA: Diagnosis not present

## 2018-12-12 ENCOUNTER — Other Ambulatory Visit: Payer: Self-pay

## 2018-12-12 ENCOUNTER — Ambulatory Visit
Admission: RE | Admit: 2018-12-12 | Discharge: 2018-12-12 | Disposition: A | Discharge status: Home / Self Care | Payer: Medicare HMO | Source: Ambulatory Visit | Attending: Obstetrics & Gynecology | Admitting: Obstetrics & Gynecology

## 2018-12-12 DIAGNOSIS — R928 Other abnormal and inconclusive findings on diagnostic imaging of breast: Secondary | ICD-10-CM | POA: Diagnosis not present

## 2018-12-12 DIAGNOSIS — N6489 Other specified disorders of breast: Secondary | ICD-10-CM | POA: Diagnosis not present

## 2018-12-12 DIAGNOSIS — N644 Mastodynia: Secondary | ICD-10-CM

## 2018-12-14 ENCOUNTER — Other Ambulatory Visit: Payer: Self-pay | Admitting: Family Medicine

## 2018-12-14 DIAGNOSIS — Z1231 Encounter for screening mammogram for malignant neoplasm of breast: Secondary | ICD-10-CM

## 2018-12-18 DIAGNOSIS — L570 Actinic keratosis: Secondary | ICD-10-CM | POA: Diagnosis not present

## 2018-12-18 DIAGNOSIS — D485 Neoplasm of uncertain behavior of skin: Secondary | ICD-10-CM | POA: Diagnosis not present

## 2018-12-18 DIAGNOSIS — D0439 Carcinoma in situ of skin of other parts of face: Secondary | ICD-10-CM | POA: Diagnosis not present

## 2018-12-25 DIAGNOSIS — C44329 Squamous cell carcinoma of skin of other parts of face: Secondary | ICD-10-CM | POA: Diagnosis not present

## 2019-04-01 DIAGNOSIS — H2513 Age-related nuclear cataract, bilateral: Secondary | ICD-10-CM | POA: Diagnosis not present

## 2019-04-01 DIAGNOSIS — H0102B Squamous blepharitis left eye, upper and lower eyelids: Secondary | ICD-10-CM | POA: Diagnosis not present

## 2019-04-01 DIAGNOSIS — H0102A Squamous blepharitis right eye, upper and lower eyelids: Secondary | ICD-10-CM | POA: Diagnosis not present

## 2019-04-12 ENCOUNTER — Ambulatory Visit: Payer: Medicare HMO | Attending: Internal Medicine

## 2019-04-12 DIAGNOSIS — Z23 Encounter for immunization: Secondary | ICD-10-CM

## 2019-04-12 NOTE — Progress Notes (Signed)
   Covid-19 Vaccination Clinic  Name:  Pamela Henderson    MRN: [id number] DOB: 1945/12/20  04/12/2019  Pamela Henderson was observed post Covid-19 immunization for 15 minutes without incident. She was provided with Vaccine Information Sheet and instruction to access the V-Safe system.   Pamela Henderson was instructed to call 911 with any severe reactions post vaccine: Marland Kitchen Difficulty breathing  . Swelling of face and throat  . A fast heartbeat  . A bad rash all over body  . Dizziness and weakness   Immunizations Administered    Name Date Dose VIS Date Route   Moderna COVID-19 Vaccine 04/12/2019  8:43 AM 0.5 mL 01/01/2019 Intramuscular   Manufacturer: Moderna   Lot: [id number]   [id number]

## 2019-05-13 DIAGNOSIS — Z20828 Contact with and (suspected) exposure to other viral communicable diseases: Secondary | ICD-10-CM | POA: Diagnosis not present

## 2019-05-15 ENCOUNTER — Ambulatory Visit: Payer: Medicare HMO | Attending: Internal Medicine

## 2019-05-15 DIAGNOSIS — Z23 Encounter for immunization: Secondary | ICD-10-CM

## 2019-05-15 NOTE — Progress Notes (Signed)
   Covid-19 Vaccination Clinic  Name:  Pamela Henderson    MRN: [id number] DOB: 02-17-1945  05/15/2019  Pamela Henderson was observed post Covid-19 immunization for 15 minutes without incident. She was provided with Vaccine Information Sheet and instruction to access the V-Safe system.   Pamela Henderson was instructed to call 911 with any severe reactions post vaccine: Marland Kitchen Difficulty breathing  . Swelling of face and throat  . A fast heartbeat  . A bad rash all over body  . Dizziness and weakness   Immunizations Administered    Name Date Dose VIS Date Route   Moderna COVID-19 Vaccine 05/15/2019  8:49 AM 0.5 mL 01/01/2019 Intramuscular   Manufacturer: Moderna   Lot: [id number]   [place]:[id number]

## 2019-06-05 ENCOUNTER — Other Ambulatory Visit: Payer: Self-pay | Admitting: Family Medicine

## 2019-06-05 DIAGNOSIS — R7303 Prediabetes: Secondary | ICD-10-CM | POA: Diagnosis not present

## 2019-06-05 DIAGNOSIS — Z789 Other specified health status: Secondary | ICD-10-CM | POA: Diagnosis not present

## 2019-06-05 DIAGNOSIS — Z20822 Contact with and (suspected) exposure to covid-19: Secondary | ICD-10-CM | POA: Diagnosis not present

## 2019-06-05 DIAGNOSIS — Z0000 Encounter for general adult medical examination without abnormal findings: Secondary | ICD-10-CM | POA: Diagnosis not present

## 2019-06-05 DIAGNOSIS — Z131 Encounter for screening for diabetes mellitus: Secondary | ICD-10-CM | POA: Diagnosis not present

## 2019-06-05 DIAGNOSIS — Z1159 Encounter for screening for other viral diseases: Secondary | ICD-10-CM | POA: Diagnosis not present

## 2019-06-05 DIAGNOSIS — E039 Hypothyroidism, unspecified: Secondary | ICD-10-CM

## 2019-06-05 DIAGNOSIS — Z114 Encounter for screening for human immunodeficiency virus [HIV]: Secondary | ICD-10-CM | POA: Diagnosis not present

## 2019-06-05 DIAGNOSIS — I517 Cardiomegaly: Secondary | ICD-10-CM | POA: Diagnosis not present

## 2019-06-05 DIAGNOSIS — E785 Hyperlipidemia, unspecified: Secondary | ICD-10-CM | POA: Diagnosis not present

## 2019-06-05 DIAGNOSIS — E0590 Thyrotoxicosis, unspecified without thyrotoxic crisis or storm: Secondary | ICD-10-CM | POA: Diagnosis not present

## 2019-06-05 DIAGNOSIS — Z13220 Encounter for screening for lipoid disorders: Secondary | ICD-10-CM | POA: Diagnosis not present

## 2019-06-05 NOTE — Telephone Encounter (Signed)
[place]. NTBS Last TSH 03/20/18

## 2019-06-06 NOTE — Telephone Encounter (Signed)
Patient  got from some where else since she could not get in before June.

## 2019-06-15 DIAGNOSIS — E785 Hyperlipidemia, unspecified: Secondary | ICD-10-CM | POA: Diagnosis not present

## 2019-06-15 DIAGNOSIS — R9431 Abnormal electrocardiogram [ECG] [EKG]: Secondary | ICD-10-CM | POA: Diagnosis not present

## 2019-06-19 DIAGNOSIS — E039 Hypothyroidism, unspecified: Secondary | ICD-10-CM | POA: Diagnosis not present

## 2019-06-19 DIAGNOSIS — Z789 Other specified health status: Secondary | ICD-10-CM | POA: Diagnosis not present

## 2019-06-19 DIAGNOSIS — E559 Vitamin D deficiency, unspecified: Secondary | ICD-10-CM | POA: Diagnosis not present

## 2019-06-19 DIAGNOSIS — R7303 Prediabetes: Secondary | ICD-10-CM | POA: Diagnosis not present

## 2019-06-19 DIAGNOSIS — Z711 Person with feared health complaint in whom no diagnosis is made: Secondary | ICD-10-CM | POA: Diagnosis not present

## 2019-06-24 DIAGNOSIS — L814 Other melanin hyperpigmentation: Secondary | ICD-10-CM | POA: Diagnosis not present

## 2019-06-24 DIAGNOSIS — L570 Actinic keratosis: Secondary | ICD-10-CM | POA: Diagnosis not present

## 2019-06-24 DIAGNOSIS — L821 Other seborrheic keratosis: Secondary | ICD-10-CM | POA: Diagnosis not present

## 2019-06-24 DIAGNOSIS — Z85828 Personal history of other malignant neoplasm of skin: Secondary | ICD-10-CM | POA: Diagnosis not present

## 2019-07-02 ENCOUNTER — Ambulatory Visit: Payer: Medicare HMO | Admitting: Family Medicine

## 2019-07-16 ENCOUNTER — Ambulatory Visit (INDEPENDENT_AMBULATORY_CARE_PROVIDER_SITE_OTHER): Payer: Medicare HMO | Admitting: Nurse Practitioner

## 2019-07-16 ENCOUNTER — Ambulatory Visit (INDEPENDENT_AMBULATORY_CARE_PROVIDER_SITE_OTHER): Payer: Medicare HMO

## 2019-07-16 ENCOUNTER — Other Ambulatory Visit: Payer: Self-pay

## 2019-07-16 ENCOUNTER — Encounter: Payer: Self-pay | Admitting: Nurse Practitioner

## 2019-07-16 VITALS — BP 114/79 | HR 89 | Temp 97.3°F | Resp 20 | Ht 67.0 in | Wt 146.0 lb

## 2019-07-16 DIAGNOSIS — R0789 Other chest pain: Secondary | ICD-10-CM | POA: Diagnosis not present

## 2019-07-16 DIAGNOSIS — S299XXA Unspecified injury of thorax, initial encounter: Secondary | ICD-10-CM | POA: Diagnosis not present

## 2019-07-16 DIAGNOSIS — M542 Cervicalgia: Secondary | ICD-10-CM | POA: Diagnosis not present

## 2019-07-16 DIAGNOSIS — R0781 Pleurodynia: Secondary | ICD-10-CM | POA: Diagnosis not present

## 2019-07-16 DIAGNOSIS — S199XXA Unspecified injury of neck, initial encounter: Secondary | ICD-10-CM | POA: Diagnosis not present

## 2019-07-16 MED ORDER — METHYLPREDNISOLONE ACETATE 80 MG/ML IJ SUSP
40.0000 mg | Freq: Once | INTRAMUSCULAR | Status: AC
  Administered 2019-07-16: 40 mg via INTRAMUSCULAR

## 2019-07-16 MED ORDER — IBUPROFEN 600 MG PO TABS: 600.0000 mg | ORAL_TABLET | Freq: Three times a day (TID) | ORAL | 0 refills | Status: DC | PRN
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
## 2019-07-16 NOTE — Assessment & Plan Note (Signed)
Neck pain not well controlled, provided education to patient with printed handout provided, pain medication given, x-rays completed results pending.  Patient knows to follow-up with worsening or unresolved symptoms.
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
## 2019-07-16 NOTE — Assessment & Plan Note (Signed)
Rib pain on right side not well controlled, x-rays completed results pending.  Provided education, pain medication given, Rx sent to pharmacy.

## 2019-12-04 ENCOUNTER — Encounter: Payer: Self-pay | Admitting: Family Medicine

## 2019-12-04 ENCOUNTER — Other Ambulatory Visit: Payer: Self-pay

## 2019-12-04 ENCOUNTER — Ambulatory Visit (INDEPENDENT_AMBULATORY_CARE_PROVIDER_SITE_OTHER): Payer: Medicare HMO | Admitting: Family Medicine

## 2019-12-04 VITALS — BP 114/79 | HR 95 | Temp 96.7°F | Resp 20 | Ht 67.0 in | Wt 136.0 lb

## 2019-12-04 DIAGNOSIS — E039 Hypothyroidism, unspecified: Secondary | ICD-10-CM

## 2019-12-04 DIAGNOSIS — R42 Dizziness and giddiness: Secondary | ICD-10-CM | POA: Diagnosis not present

## 2019-12-04 DIAGNOSIS — H6592 Unspecified nonsuppurative otitis media, left ear: Secondary | ICD-10-CM

## 2019-12-04 DIAGNOSIS — H8303 Labyrinthitis, bilateral: Secondary | ICD-10-CM | POA: Diagnosis not present

## 2019-12-04 MED ORDER — LEVOTHYROXINE SODIUM 75 MCG PO TABS: 75.0000 ug | ORAL_TABLET | Freq: Every day | ORAL | 3 refills | Status: DC

## 2019-12-04 MED ORDER — MECLIZINE HCL 25 MG PO TABS: 25.0000 mg | ORAL_TABLET | Freq: Three times a day (TID) | ORAL | 2 refills | Status: DC | PRN

## 2019-12-04 MED ORDER — MINOCYCLINE HCL 100 MG PO CAPS: 100.0000 mg | ORAL_CAPSULE | Freq: Two times a day (BID) | ORAL | 0 refills | Status: DC

## 2019-12-04 NOTE — Progress Notes (Signed)
Subjective:  Patient ID: Pamela Henderson, female    DOB: 1945-12-01  Age: 74 y.o. MRN: [id number]  CC: No chief complaint on file.   HPI Pamela Henderson presents for recurrent vertigo.  She states that for the last several days while laying down if she tries to rollover she becomes dizzy and nauseous.  Things are not clearly moving around but seem to be unstable.  She says that when she first stands up and tries to walk there will be some imbalance and feeling of things moving.  She will run into a wall when she tries to walk because of lack of ability to keep herself moving in a straight line.  This first occurred 3 years ago.  She has some meclizine left over from that occurrence and took some of it with moderate success.  Patient presents for follow-up on  thyroid. The patient has a history of hypothyroidism for many years. It has been stable recently. Pt. denies any change in  voice, loss of hair, heat or cold intolerance. Energy level has been adequate to good. Patient denies constipation and diarrhea. No myxedema. Medication is as noted below. Verified that pt is taking it daily on an empty stomach. Well tolerated.  Depression screen [facility] 2/9 12/04/2019 07/16/2019 10/16/2018  Decreased Interest 0 0 0  Down, Depressed, Hopeless 0 0 0  PHQ - 2 Score 0 0 0  Altered sleeping - - -  Tired, decreased energy - - -  Change in appetite - - -  Feeling bad or failure about yourself  - - -  Trouble concentrating - - -  Moving slowly or fidgety/restless - - -  Suicidal thoughts - - -  PHQ-9 Score - - -  Difficult doing work/chores - - -    History Pamela Henderson has a past medical history of Hormone disorder, Hyperlipidemia, Insomnia, Osteopenia (08/2016), and Thyroid disease.   She has a past surgical history that includes No past surgeries.   Her family history includes Breast cancer in her maternal grandmother; Cancer in her brother; Cancer (age of onset: 75) in her father; Hypertension in her  brother; Stroke in her mother.She reports that she has never smoked. She has never used smokeless tobacco. She reports that she does not drink alcohol and does not use drugs.    ROS Review of Systems  Constitutional: Negative.   HENT: Negative.   Eyes: Negative for visual disturbance.  Respiratory: Negative for shortness of breath.   Cardiovascular: Negative for chest pain.  Gastrointestinal: Negative for abdominal pain.  Musculoskeletal: Negative for arthralgias.  Neurological: Positive for dizziness. Negative for syncope and weakness.    Objective:  BP 114/79   Pulse 95   Temp (!) 96.7 F (35.9 C) (Temporal)   Resp 20   Ht _0  (1.702 m)   Wt 136 lb (61.7 kg)   SpO2 99%   BMI 21.30 kg/m   BP Readings from Last 3 Encounters:  12/04/19 114/79  07/16/19 114/79  11/28/18 140/78    Wt Readings from Last 3 Encounters:  12/04/19 136 lb (61.7 kg)  07/16/19 146 lb (66.2 kg)  11/28/18 146 lb (66.2 kg)     Physical Exam Constitutional:      General: She is not in acute distress.    Appearance: She is well-developed.  HENT:     Ears:     Comments: TMs bilaterally have air-fluid meniscus Cardiovascular:     Rate and Rhythm: Normal rate and regular rhythm.  Pulmonary:     Breath sounds: Normal breath sounds.  Skin:    General: Skin is warm and dry.  Neurological:     Mental Status: She is alert and oriented to person, place, and time.     Cranial Nerves: No cranial nerve deficit.     Motor: No weakness.     Coordination: Coordination normal.     Gait: Gait normal.  Psychiatric:        Mood and Affect: Mood normal.        Behavior: Behavior normal.       Assessment & Plan:   Diagnoses and all orders for this visit:  Vertigo -     CBC with Differential/Platelet -     CMP14+EGFR  Labyrinthitis of both ears -     CBC with Differential/Platelet -     CMP14+EGFR  SOM (secretory otitis media), left -     CBC with Differential/Platelet -     CMP14+EGFR   Hypothyroidism, unspecified type -     TSH + free T4 -     CBC with Differential/Platelet -     CMP14+EGFR -     levothyroxine (SYNTHROID) 75 MCG tablet; Take 1 tablet (75 mcg total) by mouth daily.  Other orders -     meclizine (ANTIVERT) 25 MG tablet; Take 1 tablet (25 mg total) by mouth 3 (three) times daily as needed for dizziness. -     minocycline (MINOCIN) 100 MG capsule; Take 1 capsule (100 mg total) by mouth 2 (two) times daily. Take on an empty stomach       I have changed Pamela Henderson's meclizine and levothyroxine. I am also having her start on minocycline. Additionally, I am having her maintain her calcium carbonate, Fish Oil, multivitamin, and ibuprofen.  Allergies as of 12/04/2019   No Known Allergies     Medication List       Accurate as of December 04, 2019  7:40 PM. If you have any questions, ask your nurse or doctor.        calcium carbonate 600 MG Tabs tablet Commonly known as: OS-CAL Take 600 mg by mouth daily with breakfast.   Fish Oil 1200 MG Caps Take 1,200 mg by mouth daily.   ibuprofen 600 MG tablet Commonly known as: ADVIL Take 1 tablet (600 mg total) by mouth every 8 (eight) hours as needed.   levothyroxine 75 MCG tablet Commonly known as: SYNTHROID Take 1 tablet (75 mcg total) by mouth daily.   meclizine 25 MG tablet Commonly known as: ANTIVERT Take 1 tablet (25 mg total) by mouth 3 (three) times daily as needed for dizziness.   minocycline 100 MG capsule Commonly known as: Minocin Take 1 capsule (100 mg total) by mouth 2 (two) times daily. Take on an empty stomach Started by: Claretta Fraise, MD   multivitamin tablet Take 1 tablet by mouth daily.        Follow-up: Return if symptoms worsen or fail to improve.  Claretta Fraise, M.D.

## 2019-12-05 LAB — CMP14+EGFR
ALT: 10 IU/L (ref 0–32)
AST: 14 IU/L (ref 0–40)
Albumin/Globulin Ratio: 2.3 — ABNORMAL HIGH (ref 1.2–2.2)
Albumin: 4.5 g/dL (ref 3.7–4.7)
Alkaline Phosphatase: 92 IU/L (ref 44–121)
BUN/Creatinine Ratio: 13 (ref 12–28)
BUN: 10 mg/dL (ref 8–27)
Bilirubin Total: 0.4 mg/dL (ref 0.0–1.2)
CO2: 25 mmol/L (ref 20–29)
Calcium: 9.4 mg/dL (ref 8.7–10.3)
Chloride: 104 mmol/L (ref 96–106)
Creatinine, Ser: 0.78 mg/dL (ref 0.57–1.00)
GFR calc Af Amer: 85 mL/min/{1.73_m2} (ref >59)
GFR calc non Af Amer: 74 mL/min/{1.73_m2} (ref >59)
Globulin, Total: 2 g/dL (ref 1.5–4.5)
Glucose: 90 mg/dL (ref 65–99)
Potassium: 4 mmol/L (ref 3.5–5.2)
Sodium: 142 mmol/L (ref 134–144)
Total Protein: 6.5 g/dL (ref 6.0–8.5)

## 2019-12-05 LAB — CBC WITH DIFFERENTIAL/PLATELET
Basophils Absolute: 0.1 10*3/uL (ref 0.0–0.2)
Basos: 1 %
EOS (ABSOLUTE): 0.1 10*3/uL (ref 0.0–0.4)
Eos: 2 %
Hematocrit: 42.6 % (ref 34.0–46.6)
Hemoglobin: 14.2 g/dL (ref 11.1–15.9)
Immature Grans (Abs): 0 10*3/uL (ref 0.0–0.1)
Immature Granulocytes: 0 %
Lymphocytes Absolute: 3.1 10*3/uL (ref 0.7–3.1)
Lymphs: 38 %
MCH: 30.5 pg (ref 26.6–33.0)
MCHC: 33.3 g/dL (ref 31.5–35.7)
MCV: 91 fL (ref 79–97)
Monocytes Absolute: 0.6 10*3/uL (ref 0.1–0.9)
Monocytes: 7 %
Neutrophils Absolute: 4.3 10*3/uL (ref 1.4–7.0)
Neutrophils: 52 %
Platelets: 271 10*3/uL (ref 150–450)
RBC: 4.66 x10E6/uL (ref 3.77–5.28)
RDW: 12.6 % (ref 11.7–15.4)
WBC: 8.1 10*3/uL (ref 3.4–10.8)

## 2019-12-05 LAB — TSH+FREE T4
Free T4: 1.63 ng/dL (ref 0.82–1.77)
TSH: 0.491 u[IU]/mL (ref 0.450–4.500)

## 2019-12-06 ENCOUNTER — Ambulatory Visit: Payer: Medicare HMO | Admitting: Obstetrics & Gynecology

## 2019-12-06 ENCOUNTER — Other Ambulatory Visit: Payer: Self-pay

## 2019-12-06 ENCOUNTER — Encounter: Payer: Self-pay | Admitting: Obstetrics & Gynecology

## 2019-12-06 VITALS — BP 118/78 | Ht 67.0 in | Wt 134.0 lb

## 2019-12-06 DIAGNOSIS — Z01419 Encounter for gynecological examination (general) (routine) without abnormal findings: Secondary | ICD-10-CM

## 2019-12-06 DIAGNOSIS — M8589 Other specified disorders of bone density and structure, multiple sites: Secondary | ICD-10-CM

## 2019-12-06 DIAGNOSIS — Z780 Asymptomatic menopausal state: Secondary | ICD-10-CM

## 2019-12-06 NOTE — Progress Notes (Signed)
Hello Tamar,  Your lab result is normal and/or stable.Some minor variations that are not significant are commonly marked abnormal, but do not represent any medical problem for you.  Best regards, Sherria Riemann, M.D.

## 2019-12-06 NOTE — Progress Notes (Signed)
Pamela Henderson Oct 16, 1945 [id number]   History:    74 y.o. G1P1L1 Divorced.  RP:  Established patient presenting for annual gyn exam   HPI: Postmenopause, well on no HRT.  No PMB.  No pelvic pain.  Abstinent x many years.  Pap neg 10/2017.  Breasts normal.  Mammo 02/2019.  Urine/BMs normal.  BMI 22.87.  Yoga and walking.  Health labs with family physician.  Treating vertigo.  [place] 2017.  BD 08/2016 Osteopenia.   Past medical history,surgical history, family history and social history were all reviewed and documented in the EPIC chart.  Gynecologic History No LMP recorded. Patient is postmenopausal.  Obstetric History OB History  Gravida Para Term Preterm AB Living  1 1       1   SAB TAB Ectopic Multiple Live Births               # Outcome Date GA Lbr Len/2nd Weight Sex Delivery Anes PTL Lv  1 Para              ROS: A ROS was performed and pertinent positives and negatives are included in the history.  GENERAL: No fevers or chills. HEENT: No change in vision, no earache, sore throat or sinus congestion. NECK: No pain or stiffness. CARDIOVASCULAR: No chest pain or pressure. No palpitations. PULMONARY: No shortness of breath, cough or wheeze. GASTROINTESTINAL: No abdominal pain, nausea, vomiting or diarrhea, melena or bright red blood per rectum. GENITOURINARY: No urinary frequency, urgency, hesitancy or dysuria. MUSCULOSKELETAL: No joint or muscle pain, no back pain, no recent trauma. DERMATOLOGIC: No rash, no itching, no lesions. ENDOCRINE: No polyuria, polydipsia, no heat or cold intolerance. No recent change in weight. HEMATOLOGICAL: No anemia or easy bruising or bleeding. NEUROLOGIC: No headache, seizures, numbness, tingling or weakness. PSYCHIATRIC: No depression, no loss of interest in normal activity or change in sleep pattern.     Exam:   BP 118/78 (BP Location: Right Arm, Patient Position: Sitting, Cuff Size: Normal)   Ht 5\' 7"  (1.702 m)   Wt 134 lb (60.8 kg)   BMI  20.99 kg/m   Body mass index is 20.99 kg/m.  General appearance : Well developed well nourished female. No acute distress HEENT: Eyes: no retinal hemorrhage or exudates,  Neck supple, trachea midline, no carotid bruits, no thyroidmegaly Lungs: Clear to auscultation, no rhonchi or wheezes, or rib retractions  Heart: Regular rate and rhythm, no murmurs or gallops Breast:Examined in sitting and supine position were symmetrical in appearance, no palpable masses or tenderness,  no skin retraction, no nipple inversion, no nipple discharge, no skin discoloration, no axillary or supraclavicular lymphadenopathy Abdomen: no palpable masses or tenderness, no rebound or guarding Extremities: no edema or skin discoloration or tenderness  Pelvic: Vulva: Normal             Vagina: No gross lesions or discharge  Cervix: No gross lesions or discharge  Uterus  AV, normal size, shape and consistency, non-tender and mobile  Adnexa  Without masses or tenderness  Anus: Normal with external hemorrhoids   Assessment/Plan:  74 y.o. female for annual exam   1. Well female exam with routine gynecological exam Normal gynecologic exam in menopause.  No indication for a Pap test this year.  Breast exam normal.  Screening mammogram January 2021 was negative.  Colonoscopy in 2017.  Good body mass index at 20.99.  Continue with fitness and healthy nutrition.  Health labs with family physician.  2. Postmenopausal Well on  no hormone replacement therapy.  No postmenopausal bleeding.  3. Osteopenia of multiple sites Bone density showing osteopenia in August 2018.  Repeat a bone density here now.  Vitamin D supplements, calcium intake of 1500 mg daily and regular weightbearing physical activities. - DG Bone Density; Future  Princess Bruins MD, 9:17 AM 12/06/2019

## 2019-12-09 ENCOUNTER — Telehealth: Payer: Self-pay

## 2019-12-10 ENCOUNTER — Other Ambulatory Visit: Payer: Self-pay | Admitting: Family Medicine

## 2019-12-10 DIAGNOSIS — R42 Dizziness and giddiness: Secondary | ICD-10-CM

## 2019-12-10 NOTE — Telephone Encounter (Signed)
I entered her referral.

## 2019-12-10 NOTE — Telephone Encounter (Signed)
Pt is having issues with the medication that was prescribed for her vertigo and is wondering if she may need to be seen by a neurologist

## 2019-12-10 NOTE — Telephone Encounter (Signed)
Patient aware and verbalizes understanding. 

## 2019-12-13 ENCOUNTER — Other Ambulatory Visit: Payer: Self-pay

## 2019-12-13 ENCOUNTER — Ambulatory Visit
Admission: RE | Admit: 2019-12-13 | Discharge: 2019-12-13 | Disposition: A | Discharge status: Home / Self Care | Payer: Medicare HMO | Source: Ambulatory Visit | Attending: Family Medicine | Admitting: Family Medicine

## 2019-12-13 DIAGNOSIS — Z1231 Encounter for screening mammogram for malignant neoplasm of breast: Secondary | ICD-10-CM | POA: Diagnosis not present

## 2019-12-17 DIAGNOSIS — L814 Other melanin hyperpigmentation: Secondary | ICD-10-CM | POA: Diagnosis not present

## 2019-12-17 DIAGNOSIS — L821 Other seborrheic keratosis: Secondary | ICD-10-CM | POA: Diagnosis not present

## 2019-12-17 DIAGNOSIS — L570 Actinic keratosis: Secondary | ICD-10-CM | POA: Diagnosis not present

## 2019-12-17 DIAGNOSIS — Z85828 Personal history of other malignant neoplasm of skin: Secondary | ICD-10-CM | POA: Diagnosis not present

## 2020-02-25 ENCOUNTER — Ambulatory Visit: Payer: Medicare HMO | Admitting: Neurology

## 2020-04-07 ENCOUNTER — Ambulatory Visit: Payer: Medicare HMO | Admitting: Neurology

## 2020-04-07 ENCOUNTER — Ambulatory Visit (INDEPENDENT_AMBULATORY_CARE_PROVIDER_SITE_OTHER): Payer: Medicare HMO | Admitting: *Deleted

## 2020-04-07 DIAGNOSIS — Z0000 Encounter for general adult medical examination without abnormal findings: Secondary | ICD-10-CM | POA: Diagnosis not present

## 2020-04-07 NOTE — Patient Instructions (Signed)
Preventive Care 61 Years and Older, Female Preventive care refers to lifestyle choices and visits with your health care provider that can promote health and wellness. This includes:  A yearly physical exam. This is also called an annual wellness visit.  Regular dental and eye exams.  Immunizations.  Screening for certain conditions.  Healthy lifestyle choices, such as: ? Eating a healthy diet. ? Getting regular exercise. ? Not using drugs or products that contain nicotine and tobacco. ? Limiting alcohol use. What can I expect for my preventive care visit? Physical exam Your health care provider will check your:  Height and weight. These may be used to calculate your BMI (body mass index). BMI is a measurement that tells if you are at a healthy weight.  Heart rate and blood pressure.  Body temperature.  Skin for abnormal spots. Counseling Your health care provider may ask you questions about your:  Past medical problems.  Family's medical history.  Alcohol, tobacco, and drug use.  Emotional well-being.  Home life and relationship well-being.  Sexual activity.  Diet, exercise, and sleep habits.  History of falls.  Memory and ability to understand (cognition).  Work and work Statistician.  Pregnancy and menstrual history.  Access to firearms. What immunizations do I need? Vaccines are usually given at various ages, according to a schedule. Your health care provider will recommend vaccines for you based on your age, medical history, and lifestyle or other factors, such as travel or where you work.   What tests do I need? Blood tests  Lipid and cholesterol levels. These may be checked every 5 years, or more often depending on your overall health.  Hepatitis C test.  Hepatitis B test. Screening  Lung cancer screening. You may have this screening every year starting at age 74 if you have a 30-pack-year history of smoking and currently smoke or have quit within  the past 15 years.  Colorectal cancer screening. ? All adults should have this screening starting at age 44 and continuing until age 58. ? Your health care provider may recommend screening at age 2 if you are at increased risk. ? You will have tests every 1-10 years, depending on your results and the type of screening test.  Diabetes screening. ? This is done by checking your blood sugar (glucose) after you have not eaten for a while (fasting). ? You may have this done every 1-3 years.  Mammogram. ? This may be done every 1-2 years. ? Talk with your health care provider about how often you should have regular mammograms.  Abdominal aortic aneurysm (AAA) screening. You may need this if you are a current or former smoker.  BRCA-related cancer screening. This may be done if you have a family history of breast, ovarian, tubal, or peritoneal cancers. Other tests  STD (sexually transmitted disease) testing, if you are at risk.  Bone density scan. This is done to screen for osteoporosis. You may have this done starting at age 104. Talk with your health care provider about your test results, treatment options, and if necessary, the need for more tests. Follow these instructions at home: Eating and drinking  Eat a diet that includes fresh fruits and vegetables, whole grains, lean protein, and low-fat dairy products. Limit your intake of foods with high amounts of sugar, saturated fats, and salt.  Take vitamin and mineral supplements as recommended by your health care provider.  Do not drink alcohol if your health care provider tells you not to drink.  If you drink alcohol: ? Limit how much you have to 0-1 drink a day. ? Be aware of how much alcohol is in your drink. In the [place] one drink equals one 12 oz bottle of beer (355 mL), one 5 oz glass of wine (148 mL), or one 1 oz glass of hard liquor (44 mL).   Lifestyle  Take daily care of your teeth and gums. Brush your teeth every morning  and night with fluoride toothpaste. Floss one time each day.  Stay active. Exercise for at least 30 minutes 5 or more days each week.  Do not use any products that contain nicotine or tobacco, such as cigarettes, e-cigarettes, and chewing tobacco. If you need help quitting, ask your health care provider.  Do not use drugs.  If you are sexually active, practice safe sex. Use a condom or other form of protection in order to prevent STIs (sexually transmitted infections).  Talk with your health care provider about taking a low-dose aspirin or statin.  Find healthy ways to cope with stress, such as: ? Meditation, yoga, or listening to music. ? Journaling. ? Talking to a trusted person. ? Spending time with friends and family. Safety  Always wear your seat belt while driving or riding in a vehicle.  Do not drive: ? If you have been drinking alcohol. Do not ride with someone who has been drinking. ? When you are tired or distracted. ? While texting.  Wear a helmet and other protective equipment during sports activities.  If you have firearms in your house, make sure you follow all gun safety procedures. What's next?  Visit your health care provider once a year for an annual wellness visit.  Ask your health care provider how often you should have your eyes and teeth checked.  Stay up to date on all vaccines. This information is not intended to replace advice given to you by your health care provider. Make sure you discuss any questions you have with your health care provider. Document Revised: 01/08/2020 Document Reviewed: 01/11/2018 [employer]vier Patient Education  2021 [employer]  

## 2020-04-07 NOTE — Progress Notes (Signed)
MEDICARE ANNUAL WELLNESS VISIT  04/07/2020  Telephone Visit Disclaimer This Medicare AWV was conducted by telephone due to national recommendations for restrictions regarding the COVID-19 Pandemic (e.g. social distancing).  I verified, using two identifiers, that I am speaking with Pamela Henderson or their authorized healthcare agent. I discussed the limitations, risks, security, and privacy concerns of performing an evaluation and management service by telephone and the potential availability of an in-person appointment in the future. The patient expressed understanding and agreed to proceed.  Location of Patient: home Location of Provider (nurse):  office  Subjective:    Pamela Henderson is a 75 y.o. female patient of Janora Norlander, DO who had a Medicare Annual Wellness Visit today via telephone. Pamela Henderson is Retired and lives alone. she has 1 child. she reports that she is socially active and does interact with friends/family regularly. she is markedly physically active and enjoys yardwork, doing yoga and riding her stationary bike.  Patient Care Team: Janora Norlander, DO as PCP - General (Family Medicine)  Advanced Directives 04/07/2020 10/16/2018 09/25/2017  Does Patient Have a Medical Advance Directive? No No No  Type of Advance Directive - - [place] in Chart? - - No - copy requested  Would patient like information on creating a medical advance directive? No - Patient declined No - Patient declined Yes (MAU/Ambulatory/Procedural Areas - Information given)    Hospital Utilization Over the Past 12 Months: # of hospitalizations or ER visits: 0 # of surgeries: 0  Review of Systems    Patient reports that her overall health is unchanged compared to last year.  History obtained from chart review and the patient  Patient Reported Readings (BP, Pulse, CBG, Weight, etc) none  Pain Assessment Pain : No/denies  pain     Current Medications & Allergies (verified) Allergies as of 04/07/2020   No Known Allergies     Medication List       Accurate as of April 07, 2020 10:08 AM. If you have any questions, ask your nurse or doctor.        STOP taking these medications   minocycline 100 MG capsule Commonly known as: Minocin     TAKE these medications   calcium carbonate 600 MG Tabs tablet Commonly known as: OS-CAL Take 600 mg by mouth daily with breakfast.   Fish Oil 1200 MG Caps Take 1,200 mg by mouth daily.   levothyroxine 75 MCG tablet Commonly known as: SYNTHROID Take 1 tablet (75 mcg total) by mouth daily.   meclizine 25 MG tablet Commonly known as: ANTIVERT Take 1 tablet (25 mg total) by mouth 3 (three) times daily as needed for dizziness.   multivitamin tablet Take 1 tablet by mouth daily.       History (reviewed): Past Medical History:  Diagnosis Date  . Hormone disorder    hypothyroidism  . Hyperlipidemia   . Insomnia   . Osteopenia 08/2016   T score -1.9 FRAX 22%/12%  . Thyroid disease    hypothyroid   Past Surgical History:  Procedure Laterality Date  . BREAST CYST ASPIRATION Left 2002  . NO PAST SURGERIES     Family History  Problem Relation Age of Onset  . Cancer Father 59       lung- smoker  . Hypertension Brother   . Cancer Brother        lung  . Stroke Mother   . Breast  cancer Maternal Grandmother    Social History   Socioeconomic History  . Marital status: Divorced    Spouse name: Not on file  . Number of children: 1  . Years of education: 10th grade-then got her GED  . Highest education level: GED or equivalent  Occupational History  . Not on file  Tobacco Use  . Smoking status: Never Smoker  . Smokeless tobacco: Never Used  Vaping Use  . Vaping Use: Never used  Substance and Sexual Activity  . Alcohol use: No  . Drug use: No  . Sexual activity: Not Currently    Birth control/protection: None    Comment: 1st intercourse- 16,  partners- 4, divorced  Other Topics Concern  . Not on file  Social History Narrative  . Not on file   Social Determinants of Health   Financial Resource Strain: Low Risk   . Difficulty of Paying Living Expenses: Not hard at all  Food Insecurity: No Food Insecurity  . Worried About Charity fundraiser in the Last Year: Never true  . Ran Out of Food in the Last Year: Never true  Transportation Needs: No Transportation Needs  . Lack of Transportation (Medical): No  . Lack of Transportation (Non-Medical): No  Physical Activity: Sufficiently Active  . Days of Exercise per Week: 7 days  . Minutes of Exercise per Session: 30 min  Stress: No Stress Concern Present  . Feeling of Stress : Not at all  Social Connections: Moderately Integrated  . Frequency of Communication with Friends and Family: More than three times a week  . Frequency of Social Gatherings with Friends and Family: More than three times a week  . Attends Religious Services: More than 4 times per year  . Active Member of Clubs or Organizations: Yes  . Attends Archivist Meetings: More than 4 times per year  . Marital Status: Divorced    Activities of Daily Living In your present state of health, do you have any difficulty performing the following activities: 04/07/2020  Hearing? N  Vision? Y  Comment has cataracts  Difficulty concentrating or making decisions? N  Walking or climbing stairs? N  Dressing or bathing? N  Doing errands, shopping? N  Preparing Food and eating ? N  Using the Toilet? N  In the past six months, have you accidently leaked urine? Y  Comment rarely and only if she waits too long  Do you have problems with loss of bowel control? N  Managing your Medications? N  Managing your Finances? N  Housekeeping or managing your Housekeeping? N  Some recent data might be hidden    Patient Education/ Literacy How often do you need to have someone help you when you read instructions, pamphlets,  or other written materials from your doctor or pharmacy?: 1 - Never What is the last grade level you completed in school?: 10th grade-then got her GED  Exercise Current Exercise Habits: Home exercise routine, Type of exercise: yoga;walking, Time (Minutes): 30, Frequency (Times/Week): 7, Weekly Exercise (Minutes/Week): 210, Intensity: Moderate, Exercise limited by: None identified  Diet Patient reports consuming 3 meals a day and 1 snack(s) a day Patient reports that her primary diet is: Regular Patient reports that she does have regular access to food.   Depression Screen PHQ 2/9 Scores 04/07/2020 12/04/2019 07/16/2019 10/16/2018 08/13/2018 04/05/2018 03/13/2018  PHQ - 2 Score 0 0 0 0 0 0 0  PHQ- 9 Score - - - - 0 - 0  Fall Risk Fall Risk  04/07/2020 12/04/2019 07/16/2019 10/16/2018 08/13/2018  Falls in the past year? 0 0 1 0 0  Number falls in past yr: - - 0 0 -  Injury with Fall? - - 0 0 -  Risk for fall due to : - - History of fall(s) - -  Follow up - Falls evaluation completed Falls evaluation completed Falls prevention discussed -  Comment - - - get rid of all throw rugs in the house, adequate lighting in the walkway and grab bars in the bathroom -     Objective:  Pamela Henderson seemed alert and oriented and she participated appropriately during our telephone visit.  Blood Pressure Weight BMI  BP Readings from Last 3 Encounters:  12/06/19 118/78  12/04/19 114/79  07/16/19 114/79   Wt Readings from Last 3 Encounters:  12/06/19 134 lb (60.8 kg)  12/04/19 136 lb (61.7 kg)  07/16/19 146 lb (66.2 kg)   BMI Readings from Last 1 Encounters:  12/06/19 20.99 kg/m    *Unable to obtain current vital signs, weight, and BMI due to telephone visit type  Hearing/Vision  . Chemika did not seem to have difficulty with hearing/understanding during the telephone conversation . Reports that she has not had a formal eye exam by an eye care professional within the past year . Reports that she has  not had a formal hearing evaluation within the past year *Unable to fully assess hearing and vision during telephone visit type  Cognitive Function: 6CIT Screen 04/07/2020 10/16/2018 09/25/2017  What Year? 0 points 0 points 0 points  What month? 0 points 0 points 0 points  What time? 0 points 0 points 0 points  Count back from 20 0 points 0 points 0 points  Months in reverse 4 points 0 points 0 points  Repeat phrase 0 points 0 points 0 points  Total Score 4 0 0   (Normal:0-7, Significant for Dysfunction: >8)  Normal Cognitive Function Screening: Yes   Immunization & Health Maintenance Record Immunization History  Administered Date(s) Administered  . Influenza, High Dose Seasonal PF 11/17/2016, 11/06/2017, 12/08/2018  . Moderna Sars-Covid-2 Vaccination 04/12/2019, 05/15/2019    Health Maintenance  Topic Date Due  . Hepatitis C Screening  Never done  . TETANUS/TDAP  Never done  . PNA vac Low Risk Adult (1 of 2 - PCV13) Never done  . COVID-19 Vaccine (3 - Booster for Moderna series) 11/14/2019  . INFLUENZA VACCINE  04/30/2020 (Originally 09/01/2019)  . DEXA SCAN  Completed  . HPV VACCINES  Aged Out       Assessment  This is a routine wellness examination for Pamela Henderson.  Health Maintenance: Due or Overdue Health Maintenance Due  Topic Date Due  . Hepatitis C Screening  Never done  . TETANUS/TDAP  Never done  . PNA vac Low Risk Adult (1 of 2 - PCV13) Never done  . COVID-19 Vaccine (3 - Booster for Moderna series) 11/14/2019    Pamela Henderson does not need a referral for Community Assistance: Care Management:   no Social Work:    no Prescription Assistance:  no Nutrition/Diabetes Education:  no   Plan:  Personalized Goals Goals Addressed              This Visit's Progress   .  Patient Stated (pt-stated)        " I would like to help the people in my community"      Personalized Health Maintenance &  Screening Recommendations  Pneumococcal vaccine   Td vaccine  Lung Cancer Screening Recommended: no (Low Dose CT Chest recommended if Age 31-80 years, 30 pack-year currently smoking OR have quit w/in past 15 years) Hepatitis C Screening recommended: no HIV Screening recommended: no  Advanced Directives: Written information was not prepared per patient's request.  Referrals & Orders No orders of the defined types were placed in this encounter.   Follow-up Plan . Follow-up with Janora Norlander, DO as planned . Keep your appointment with your Eye Doctor for your blurred vision . Consider TDAP and Prevnar vaccines at your next visit with your PCP   I have personally reviewed and noted the following in the patient's chart:   . Medical and social history . Use of alcohol, tobacco or illicit drugs  . Current medications and supplements . Functional ability and status . Nutritional status . Physical activity . Advanced directives . List of other physicians . Hospitalizations, surgeries, and ER visits in previous 12 months . Vitals . Screenings to include cognitive, depression, and falls . Referrals and appointments  In addition, I have reviewed and discussed with Pamela Henderson certain preventive protocols, quality metrics, and best practice recommendations. A written personalized care plan for preventive services as well as general preventive health recommendations is available and can be mailed to the patient at her request.      Milas Hock, LPN  10/02/3298

## 2020-04-15 ENCOUNTER — Ambulatory Visit (INDEPENDENT_AMBULATORY_CARE_PROVIDER_SITE_OTHER): Payer: Medicare HMO | Admitting: Family Medicine

## 2020-04-15 ENCOUNTER — Other Ambulatory Visit: Payer: Self-pay

## 2020-04-15 VITALS — BP 123/73 | HR 72 | Temp 96.9°F | Ht 67.0 in | Wt 133.0 lb

## 2020-04-15 DIAGNOSIS — Z0001 Encounter for general adult medical examination with abnormal findings: Secondary | ICD-10-CM

## 2020-04-15 DIAGNOSIS — R42 Dizziness and giddiness: Secondary | ICD-10-CM | POA: Diagnosis not present

## 2020-04-15 DIAGNOSIS — R634 Abnormal weight loss: Secondary | ICD-10-CM

## 2020-04-15 DIAGNOSIS — M85852 Other specified disorders of bone density and structure, left thigh: Secondary | ICD-10-CM

## 2020-04-15 DIAGNOSIS — Z0000 Encounter for general adult medical examination without abnormal findings: Secondary | ICD-10-CM

## 2020-04-15 DIAGNOSIS — E039 Hypothyroidism, unspecified: Secondary | ICD-10-CM

## 2020-04-15 LAB — BAYER DCA HB A1C WAIVED: HB A1C (BAYER DCA - WAIVED): 5.6 % (ref <7.0)

## 2020-04-15 MED ORDER — MECLIZINE HCL 25 MG PO TABS
25.0000 mg | ORAL_TABLET | Freq: Three times a day (TID) | ORAL | 2 refills | Status: DC | PRN
Start: 2020-04-15 — End: 2022-02-17

## 2020-04-15 NOTE — Progress Notes (Signed)
Pamela Henderson is a 75 y.o. female presents to office today for annual physical exam examination.    Concerns today include: 1.  Osteopenia Patient with known osteopenia.  Last DEXA scan was more than 2 years ago.  She tries to stay physically active and is compliant with her calcium supplement.  No reports of bone pain.  2.  Hypothyroidism Patient is compliant with Synthroid 75 mcg daily.  Reports of tremor, change in bowel habits, heart palpitations, difficulty swallowing or change in voice.  She does report unplanned weight loss has been ongoing for many years but seem to be exacerbated after her granddaughter got ill.  She denies any early satiety, abdominal pain, hematochezia or melena.  No night sweats.  3.  Vertigo Patient with ongoing dizziness and vertigo.  She states that this occurs on a nightly basis when she lays down at night.  She has been evaluated by the ENT and was subsequently referred to neurology.  She unfortunately missed both of her neurology appointments due to coughing on day of scheduled appointment and the other was missed due to a emergency on the provider side.  She has not yet since rescheduled.  Has not had an MRI.  Occupation: Retired  Diet: Insurance underwriter., Exercise: Stays active Refills needed today: Meclizine Immunizations needed:  Immunization History  Administered Date(s) Administered  . Influenza, High Dose Seasonal PF 11/17/2016, 11/06/2017, 12/08/2018  . Moderna Sars-Covid-2 Vaccination 04/12/2019, 05/15/2019     Past Medical History:  Diagnosis Date  . Hormone disorder    hypothyroidism  . Hyperlipidemia   . Insomnia   . Osteopenia 08/2016   T score -1.9 FRAX 22%/12%  . Thyroid disease    hypothyroid   Social History   Socioeconomic History  . Marital status: Divorced    Spouse name: Not on file  . Number of children: 1  . Years of education: 10th grade-then got her GED  . Highest education level: GED or equivalent  Occupational  History  . Not on file  Tobacco Use  . Smoking status: Never Smoker  . Smokeless tobacco: Never Used  Vaping Use  . Vaping Use: Never used  Substance and Sexual Activity  . Alcohol use: No  . Drug use: No  . Sexual activity: Not Currently    Birth control/protection: None    Comment: 1st intercourse- 16, partners- 4, divorced  Other Topics Concern  . Not on file  Social History Narrative  . Not on file   Social Determinants of Health   Financial Resource Strain: Low Risk   . Difficulty of Paying Living Expenses: Not hard at all  Food Insecurity: No Food Insecurity  . Worried About Charity fundraiser in the Last Year: Never true  . Ran Out of Food in the Last Year: Never true  Transportation Needs: No Transportation Needs  . Lack of Transportation (Medical): No  . Lack of Transportation (Non-Medical): No  Physical Activity: Sufficiently Active  . Days of Exercise per Week: 7 days  . Minutes of Exercise per Session: 30 min  Stress: No Stress Concern Present  . Feeling of Stress : Not at all  Social Connections: Moderately Integrated  . Frequency of Communication with Friends and Family: More than three times a week  . Frequency of Social Gatherings with Friends and Family: More than three times a week  . Attends Religious Services: More than 4 times per year  . Active Member of Clubs or Organizations: Yes  .  Attends Archivist Meetings: More than 4 times per year  . Marital Status: Divorced  Human resources officer Violence: Not At Risk  . Fear of Current or Ex-Partner: No  . Emotionally Abused: No  . Physically Abused: No  . Sexually Abused: No   Past Surgical History:  Procedure Laterality Date  . BREAST CYST ASPIRATION Left 2002  . NO PAST SURGERIES     Family History  Problem Relation Age of Onset  . Cancer Father 59       lung- smoker  . Hypertension Brother   . Cancer Brother        lung  . Stroke Mother   . Breast cancer Maternal Grandmother      Current Outpatient Medications:  .  calcium carbonate (OS-CAL) 600 MG TABS, Take 600 mg by mouth daily with breakfast. , Disp: , Rfl:  .  levothyroxine (SYNTHROID) 75 MCG tablet, Take 1 tablet (75 mcg total) by mouth daily., Disp: 90 tablet, Rfl: 3 .  Multiple Vitamin (MULTIVITAMIN) tablet, Take 1 tablet by mouth daily., Disp: , Rfl:  .  Omega-3 Fatty Acids (FISH OIL) 1200 MG CAPS, Take 1,200 mg by mouth daily. , Disp: , Rfl:  .  meclizine (ANTIVERT) 25 MG tablet, Take 1 tablet (25 mg total) by mouth 3 (three) times daily as needed for dizziness., Disp: 30 tablet, Rfl: 2  No Known Allergies   ROS: Review of Systems Pertinent items noted in HPI and remainder of comprehensive ROS otherwise negative.    Physical exam BP 123/73   Pulse 72   Temp (!) 96.9 F (36.1 C) (Temporal)   Ht _0  (1.702 m)   Wt 133 lb (60.3 kg)   SpO2 99%   BMI 20.83 kg/m  General appearance: alert, cooperative, appears stated age and no distress Head: Normocephalic, without obvious abnormality, atraumatic Eyes: negative findings: lids and lashes normal, conjunctivae and sclerae normal, corneas clear and pupils equal, round, reactive to light and accomodation Ears: normal TM's and external ear canals both ears Nose: Nares normal. Septum midline. Mucosa normal. No drainage or sinus tenderness. Throat: lips, mucosa, and tongue normal; teeth and gums normal Neck: no adenopathy, supple, symmetrical, trachea midline and thyroid not enlarged, symmetric, no tenderness/mass/nodules Back: symmetric, no curvature. ROM normal. No CVA tenderness. Lungs: clear to auscultation bilaterally Heart: regular rate and rhythm, S1, S2 normal, no murmur, click, rub or gallop Abdomen: soft, non-tender; bowel sounds normal; no masses,  no organomegaly Extremities: extremities normal, atraumatic, no cyanosis or edema Pulses: 2+ and symmetric Skin: Skin color, texture, turgor normal. No rashes or lesions Lymph nodes: Cervical,  supraclavicular, and axillary nodes normal. Neurologic: Grossly normal Psych: Mood stable, speech normal, affect appropriate   Assessment/ Plan: [name] here for annual physical exam.   Annual physical exam  Acquired hypothyroidism - Plan: Thyroid Panel With TSH  Osteopenia of neck of left femur - Plan: DG [facility] DEXA, CMP14+EGFR, VITAMIN D 25 Hydroxy (Vit-D Deficiency, Fractures)  Unintended weight loss - Plan: CBC with Differential, CMP14+EGFR, VITAMIN D 25 Hydroxy (Vit-D Deficiency, Fractures), Bayer DCA Hb A1c Waived  Vertigo - Plan: MR Brain Wo Contrast  Check thyroid panel  Plan for DEXA but she would like to hold off on this today.  Check vitamin D, CMP  Weight loss has been a gradual issue over the last several years.  No apparent focal symptoms or signs to suggest malignant etiology.  We will look for metabolic etiology first but may really need  to consider pursuing other eval if weight loss persists.  A1c did not demonstrate diabetes.  With regards to her vertigo, I am going to order an MRI brain which I suspect the neurologist would want anyways.  We can plan reevaluation with neurology pending results of MRI.  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  Patient to follow up in 1 year for annual exam or sooner if needed.  Ashly M. Lajuana Ripple, DO

## 2020-04-15 NOTE — Patient Instructions (Signed)

## 2020-04-16 LAB — THYROID PANEL WITH TSH
Free Thyroxine Index: 3.3 (ref 1.2–4.9)
T3 Uptake Ratio: 32 % (ref 24–39)
T4, Total: 10.4 ug/dL (ref 4.5–12.0)
TSH: 0.351 u[IU]/mL — ABNORMAL LOW (ref 0.450–4.500)

## 2020-04-16 LAB — CMP14+EGFR
ALT: 10 IU/L (ref 0–32)
AST: 16 IU/L (ref 0–40)
Albumin/Globulin Ratio: 2 (ref 1.2–2.2)
Albumin: 4.3 g/dL (ref 3.7–4.7)
Alkaline Phosphatase: 93 IU/L (ref 44–121)
BUN/Creatinine Ratio: 14 (ref 12–28)
BUN: 11 mg/dL (ref 8–27)
Bilirubin Total: 0.5 mg/dL (ref 0.0–1.2)
CO2: 24 mmol/L (ref 20–29)
Calcium: 9.7 mg/dL (ref 8.7–10.3)
Chloride: 105 mmol/L (ref 96–106)
Creatinine, Ser: 0.77 mg/dL (ref 0.57–1.00)
Globulin, Total: 2.1 g/dL (ref 1.5–4.5)
Glucose: 90 mg/dL (ref 65–99)
Potassium: 4.5 mmol/L (ref 3.5–5.2)
Sodium: 143 mmol/L (ref 134–144)
Total Protein: 6.4 g/dL (ref 6.0–8.5)
eGFR: 80 mL/min/{1.73_m2} (ref >59)

## 2020-04-16 LAB — CBC WITH DIFFERENTIAL/PLATELET
Basophils Absolute: 0 10*3/uL (ref 0.0–0.2)
Basos: 1 %
EOS (ABSOLUTE): 0.1 10*3/uL (ref 0.0–0.4)
Eos: 1 %
Hematocrit: 43.3 % (ref 34.0–46.6)
Hemoglobin: 14.2 g/dL (ref 11.1–15.9)
Immature Grans (Abs): 0 10*3/uL (ref 0.0–0.1)
Immature Granulocytes: 0 %
Lymphocytes Absolute: 2.7 10*3/uL (ref 0.7–3.1)
Lymphs: 38 %
MCH: 29.3 pg (ref 26.6–33.0)
MCHC: 32.8 g/dL (ref 31.5–35.7)
MCV: 90 fL (ref 79–97)
Monocytes Absolute: 0.4 10*3/uL (ref 0.1–0.9)
Monocytes: 6 %
Neutrophils Absolute: 3.9 10*3/uL (ref 1.4–7.0)
Neutrophils: 54 %
Platelets: 268 10*3/uL (ref 150–450)
RBC: 4.84 x10E6/uL (ref 3.77–5.28)
RDW: 12.8 % (ref 11.7–15.4)
WBC: 7.2 10*3/uL (ref 3.4–10.8)

## 2020-04-16 LAB — VITAMIN D 25 HYDROXY (VIT D DEFICIENCY, FRACTURES): Vit D, 25-Hydroxy: 28.2 ng/mL — ABNORMAL LOW (ref 30.0–100.0)

## 2020-04-17 ENCOUNTER — Other Ambulatory Visit: Payer: Self-pay | Admitting: *Deleted

## 2020-04-17 DIAGNOSIS — E039 Hypothyroidism, unspecified: Secondary | ICD-10-CM

## 2020-04-23 ENCOUNTER — Encounter: Payer: Self-pay | Admitting: *Deleted

## 2020-04-24 DIAGNOSIS — H0102B Squamous blepharitis left eye, upper and lower eyelids: Secondary | ICD-10-CM | POA: Diagnosis not present

## 2020-04-24 DIAGNOSIS — H43813 Vitreous degeneration, bilateral: Secondary | ICD-10-CM | POA: Diagnosis not present

## 2020-04-24 DIAGNOSIS — H2513 Age-related nuclear cataract, bilateral: Secondary | ICD-10-CM | POA: Diagnosis not present

## 2020-04-24 DIAGNOSIS — H0102A Squamous blepharitis right eye, upper and lower eyelids: Secondary | ICD-10-CM | POA: Diagnosis not present

## 2020-04-29 ENCOUNTER — Ambulatory Visit (HOSPITAL_COMMUNITY)
Admission: RE | Admit: 2020-04-29 | Discharge: 2020-04-29 | Disposition: A | Discharge status: Home / Self Care | Payer: Medicare HMO | Source: Ambulatory Visit | Attending: Family Medicine | Admitting: Family Medicine

## 2020-04-29 DIAGNOSIS — G9389 Other specified disorders of brain: Secondary | ICD-10-CM | POA: Diagnosis not present

## 2020-04-29 DIAGNOSIS — R42 Dizziness and giddiness: Secondary | ICD-10-CM | POA: Diagnosis not present

## 2020-05-25 ENCOUNTER — Other Ambulatory Visit: Payer: Self-pay

## 2020-05-25 ENCOUNTER — Telehealth: Payer: Self-pay

## 2020-05-25 ENCOUNTER — Other Ambulatory Visit: Payer: Medicare HMO

## 2020-05-25 DIAGNOSIS — E039 Hypothyroidism, unspecified: Secondary | ICD-10-CM | POA: Diagnosis not present

## 2020-05-25 NOTE — Telephone Encounter (Signed)
Patient aware Pamela Henderson is off today. Patient had MRI on 04/29/20. Someone told her that it can show if she has dementia. Patient is just asking if you saw any signs of dementia on MRI of the brain.

## 2020-05-26 LAB — TSH: TSH: 0.525 u[IU]/mL (ref 0.450–4.500)

## 2020-05-26 NOTE — Telephone Encounter (Signed)
There were no findings to suggest dementia but she did have a few small, old strokes noted.  BP, sugar and cholesterol control is essential

## 2020-05-26 NOTE — Telephone Encounter (Signed)
Patient aware of results and verbalizes understanding.  

## 2020-06-11 DIAGNOSIS — H2511 Age-related nuclear cataract, right eye: Secondary | ICD-10-CM | POA: Diagnosis not present

## 2020-06-17 DIAGNOSIS — L570 Actinic keratosis: Secondary | ICD-10-CM | POA: Diagnosis not present

## 2020-10-01 ENCOUNTER — Other Ambulatory Visit: Payer: Self-pay

## 2020-10-01 ENCOUNTER — Encounter: Payer: Self-pay | Admitting: Family Medicine

## 2020-10-01 ENCOUNTER — Ambulatory Visit (INDEPENDENT_AMBULATORY_CARE_PROVIDER_SITE_OTHER): Payer: Medicare HMO | Admitting: Family Medicine

## 2020-10-01 VITALS — BP 131/76 | HR 78 | Temp 97.4°F | Ht 67.0 in | Wt 133.0 lb

## 2020-10-01 DIAGNOSIS — R42 Dizziness and giddiness: Secondary | ICD-10-CM | POA: Diagnosis not present

## 2020-10-01 DIAGNOSIS — R112 Nausea with vomiting, unspecified: Secondary | ICD-10-CM | POA: Diagnosis not present

## 2020-10-01 DIAGNOSIS — Z0000 Encounter for general adult medical examination without abnormal findings: Secondary | ICD-10-CM

## 2020-10-01 LAB — CBC WITH DIFFERENTIAL/PLATELET
Basophils Absolute: 0 10*3/uL (ref 0.0–0.2)
Basos: 1 %
EOS (ABSOLUTE): 0.1 10*3/uL (ref 0.0–0.4)
Eos: 1 %
Hematocrit: 41.8 % (ref 34.0–46.6)
Hemoglobin: 13.7 g/dL (ref 11.1–15.9)
Immature Grans (Abs): 0 10*3/uL (ref 0.0–0.1)
Immature Granulocytes: 0 %
Lymphocytes Absolute: 2.1 10*3/uL (ref 0.7–3.1)
Lymphs: 32 %
MCH: 29.6 pg (ref 26.6–33.0)
MCHC: 32.8 g/dL (ref 31.5–35.7)
MCV: 90 fL (ref 79–97)
Monocytes Absolute: 0.5 10*3/uL (ref 0.1–0.9)
Monocytes: 7 %
Neutrophils Absolute: 3.9 10*3/uL (ref 1.4–7.0)
Neutrophils: 59 %
Platelets: 239 10*3/uL (ref 150–450)
RBC: 4.63 x10E6/uL (ref 3.77–5.28)
RDW: 13.2 % (ref 11.7–15.4)
WBC: 6.7 10*3/uL (ref 3.4–10.8)

## 2020-10-01 LAB — BMP8+EGFR
BUN/Creatinine Ratio: 14 (ref 12–28)
BUN: 10 mg/dL (ref 8–27)
CO2: 24 mmol/L (ref 20–29)
Calcium: 9.3 mg/dL (ref 8.7–10.3)
Chloride: 106 mmol/L (ref 96–106)
Creatinine, Ser: 0.73 mg/dL (ref 0.57–1.00)
Glucose: 77 mg/dL (ref 65–99)
Potassium: 3.9 mmol/L (ref 3.5–5.2)
Sodium: 143 mmol/L (ref 134–144)
eGFR: 85 mL/min/{1.73_m2} (ref >59)

## 2020-10-01 NOTE — Progress Notes (Signed)
Subjective:  Patient ID: TARNISHA KACHMAR, female    DOB: [birth year], 75 y.o.   MRN: [id number]  Patient Care Team: Janora Norlander, DO as PCP - General (Family Medicine)   Chief Complaint:  Dizziness   HPI: SHAIVI ROTHSCHILD is a 75 y.o. female presenting on 10/01/2020 for Dizziness   Pt presents today with complaints of dizziness and nausea, one episode of vomiting with the dizziness. Dizziness has been intermittent for 1 week. No focal neurological deficits. Symptoms worse with standing, bending over, and certain positional changes.   Dizziness This is a recurrent problem. The current episode started in the past 7 days. The problem occurs intermittently. The problem has been waxing and waning. Associated symptoms include nausea, vertigo and vomiting. Pertinent negatives include no abdominal pain, anorexia, arthralgias, change in bowel habit, chest pain, chills, congestion, coughing, diaphoresis, fatigue, fever, headaches, joint swelling, myalgias, neck pain, numbness, rash, sore throat, swollen glands, urinary symptoms, visual change or weakness. The symptoms are aggravated by bending, standing and twisting. She has tried nothing for the symptoms. The treatment provided no relief.    Relevant past medical, surgical, family, and social history reviewed and updated as indicated.  Allergies and medications reviewed and updated. Data reviewed: Chart in Epic.   Past Medical History:  Diagnosis Date   Hormone disorder    hypothyroidism   Hyperlipidemia    Insomnia    Osteopenia 08/2016   T score -1.9 FRAX 22%/12%   Thyroid disease    hypothyroid    Past Surgical History:  Procedure Laterality Date   BREAST CYST ASPIRATION Left 2002   NO PAST SURGERIES      Social History   Socioeconomic History   Marital status: Divorced    Spouse name: Not on file   Number of children: 1   Years of education: 10th grade-then got her GED   Highest education level: GED or equivalent   Occupational History   Not on file  Tobacco Use   Smoking status: Never   Smokeless tobacco: Never  Vaping Use   Vaping Use: Never used  Substance and Sexual Activity   Alcohol use: No   Drug use: No   Sexual activity: Not Currently    Birth control/protection: None    Comment: 1st intercourse- 66, partners- 12, divorced  Other Topics Concern   Not on file  Social History Narrative   Not on file   Social Determinants of Health   Financial Resource Strain: Low Risk    Difficulty of Paying Living Expenses: Not hard at all  Food Insecurity: No Food Insecurity   Worried About Charity fundraiser in the Last Year: Never true   Ran Out of Food in the Last Year: Never true  Transportation Needs: No Transportation Needs   Lack of Transportation (Medical): No   Lack of Transportation (Non-Medical): No  Physical Activity: Sufficiently Active   Days of Exercise per Week: 7 days   Minutes of Exercise per Session: 30 min  Stress: No Stress Concern Present   Feeling of Stress : Not at all  Social Connections: Moderately Integrated   Frequency of Communication with Friends and Family: More than three times a week   Frequency of Social Gatherings with Friends and Family: More than three times a week   Attends Religious Services: More than 4 times per year   Active Member of [employer] or Organizations: Yes   Attends Archivist Meetings: More than 4 times  per year   Marital Status: Divorced  Human resources officer Violence: Not At Risk   Fear of Current or Ex-Partner: No   Emotionally Abused: No   Physically Abused: No   Sexually Abused: No    Outpatient Encounter Medications as of 10/01/2020  Medication Sig   calcium carbonate (OS-CAL) 600 MG TABS Take 600 mg by mouth daily with breakfast.    levothyroxine (SYNTHROID) 75 MCG tablet Take 1 tablet (75 mcg total) by mouth daily.   meclizine (ANTIVERT) 25 MG tablet Take 1 tablet (25 mg total) by mouth 3 (three) times daily as needed for  dizziness.   Multiple Vitamin (MULTIVITAMIN) tablet Take 1 tablet by mouth daily.   Omega-3 Fatty Acids (FISH OIL) 1200 MG CAPS Take 1,200 mg by mouth daily.    No facility-administered encounter medications on file as of 10/01/2020.    No Known Allergies  Review of Systems  Constitutional:  Negative for activity change, appetite change, chills, diaphoresis, fatigue, fever and unexpected weight change.  HENT: Negative.  Negative for congestion and sore throat.   Eyes: Negative.  Negative for photophobia and visual disturbance.  Respiratory:  Negative for cough, chest tightness and shortness of breath.   Cardiovascular:  Negative for chest pain, palpitations and leg swelling.  Gastrointestinal:  Positive for nausea and vomiting. Negative for abdominal distention, abdominal pain, anal bleeding, anorexia, blood in stool, change in bowel habit, constipation, diarrhea and rectal pain.  Endocrine: Negative.   Genitourinary:  Negative for decreased urine volume, difficulty urinating, dysuria, frequency and urgency.  Musculoskeletal:  Negative for arthralgias, joint swelling, myalgias and neck pain.  Skin: Negative.  Negative for rash.  Allergic/Immunologic: Negative.   Neurological:  Positive for dizziness and vertigo. Negative for tremors, seizures, syncope, facial asymmetry, speech difficulty, weakness, light-headedness, numbness and headaches.  Hematological: Negative.   Psychiatric/Behavioral:  Negative for confusion, hallucinations, sleep disturbance and suicidal ideas.   All other systems reviewed and are negative.      Objective:  BP 131/76   Pulse 78   Temp (!) 97.4 F (36.3 C) (Temporal)   Ht _0  (1.702 m)   Wt 133 lb (60.3 kg)   SpO2 94%   BMI 20.83 kg/m    Wt Readings from Last 3 Encounters:  10/01/20 133 lb (60.3 kg)  04/15/20 133 lb (60.3 kg)  12/06/19 134 lb (60.8 kg)    Physical Exam Vitals and nursing note reviewed.  Constitutional:      General: She is not in  acute distress.    Appearance: Normal appearance. She is well-developed, well-groomed and normal weight. She is not ill-appearing, toxic-appearing or diaphoretic.  HENT:     Head: Normocephalic and atraumatic.     Jaw: There is normal jaw occlusion.     Right Ear: Hearing, tympanic membrane, ear canal and external ear normal. No middle ear effusion.     Left Ear: Hearing, tympanic membrane, ear canal and external ear normal.  No middle ear effusion.     Nose: Nose normal.     Mouth/Throat:     Lips: Pink.     Mouth: Mucous membranes are moist.     Pharynx: Oropharynx is clear. Uvula midline.  Eyes:     General: Lids are normal. No visual field deficit.    Extraocular Movements: Extraocular movements intact.     Right eye: Normal extraocular motion and no nystagmus.     Left eye: Normal extraocular motion and no nystagmus.     Conjunctiva/sclera: Conjunctivae  normal.     Pupils: Pupils are equal, round, and reactive to light.  Neck:     Thyroid: No thyroid mass, thyromegaly or thyroid tenderness.     Vascular: No carotid bruit or JVD.     Trachea: Trachea and phonation normal.  Cardiovascular:     Rate and Rhythm: Normal rate and regular rhythm.     Chest Wall: PMI is not displaced.     Pulses: Normal pulses.     Heart sounds: Normal heart sounds. No murmur heard.   No friction rub. No gallop.  Pulmonary:     Effort: Pulmonary effort is normal. No respiratory distress.     Breath sounds: Normal breath sounds. No wheezing.  Abdominal:     General: Bowel sounds are normal. There is no distension or abdominal bruit.     Palpations: Abdomen is soft. There is no hepatomegaly or splenomegaly.     Tenderness: There is no abdominal tenderness. There is no right CVA tenderness or left CVA tenderness.     Hernia: No hernia is present.  Musculoskeletal:        General: Normal range of motion.     Cervical back: Normal range of motion and neck supple.     Right lower leg: No edema.      Left lower leg: No edema.  Lymphadenopathy:     Cervical: No cervical adenopathy.  Skin:    General: Skin is warm and dry.     Capillary Refill: Capillary refill takes less than 2 seconds.     Coloration: Skin is not cyanotic, jaundiced or pale.     Findings: No rash.  Neurological:     General: No focal deficit present.     Mental Status: She is alert and oriented to person, place, and time.     GCS: GCS eye subscore is 4. GCS verbal subscore is 5. GCS motor subscore is 6.     Cranial Nerves: Cranial nerves are intact. No cranial nerve deficit, dysarthria or facial asymmetry.     Sensory: Sensation is intact. No sensory deficit.     Motor: Motor function is intact. No weakness, tremor, atrophy, abnormal muscle tone, seizure activity or pronator drift.     Coordination: Coordination is intact. Romberg sign negative. Coordination normal. Finger-Nose-Finger Test and Heel to [facility] Test normal. Rapid alternating movements normal.     Gait: Gait is intact. Gait and tandem walk normal.     Deep Tendon Reflexes: Reflexes are normal and symmetric.  Psychiatric:        Attention and Perception: Attention and perception normal.        Mood and Affect: Mood and affect normal.        Speech: Speech normal.        Behavior: Behavior normal. Behavior is cooperative.        Thought Content: Thought content normal.        Cognition and Memory: Cognition and memory normal.        Judgment: Judgment normal.    Results for orders placed or performed in visit on 05/25/20  TSH  Result Value Ref Range   TSH 0.525 0.450 - 4.500 uIU/mL       Pertinent labs & imaging results that were available during my care of the patient were reviewed by me and considered in my medical decision making.  Assessment & Plan:  Odette was seen today for dizziness.  Diagnoses and all orders for this visit:  Dizziness Normal neurological  exam Nausea and vomiting in adult Neurological exam normal. Slight drop in blood  pressure with standing, no change in HR. Dizziness with certain movements and bending over, likely vertigo. Will check for potential underlying causes such as hyponatremia, dehydration, or anemia. Pt has PRN meclizine prescribed but has not been taking. Pt aware to take as prescribed, sedation precautions provided. Pt aware of symptoms which require emergent evaluation and treatment.  -     BMP8+EGFR -     CBC with Differential/Platelet    Continue all other maintenance medications.  Follow up plan: Return if symptoms worsen or fail to improve.   Continue healthy lifestyle choices, including diet (rich in fruits, vegetables, and lean proteins, and low in salt and simple carbohydrates) and exercise (at least 30 minutes of moderate physical activity daily).  Educational handout given for vertigo  The above assessment and management plan was discussed with the patient. The patient verbalized understanding of and has agreed to the management plan. Patient is aware to call the clinic if they develop any new symptoms or if symptoms persist or worsen. Patient is aware when to return to the clinic for a follow-up visit. Patient educated on when it is appropriate to go to the emergency department.   Monia Pouch, FNP-C [facility] [phone]
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
## 2020-11-27 ENCOUNTER — Other Ambulatory Visit: Payer: Self-pay

## 2020-11-27 ENCOUNTER — Ambulatory Visit (INDEPENDENT_AMBULATORY_CARE_PROVIDER_SITE_OTHER): Payer: Medicare HMO

## 2020-11-27 DIAGNOSIS — Z23 Encounter for immunization: Secondary | ICD-10-CM | POA: Diagnosis not present

## 2020-12-07 ENCOUNTER — Encounter: Payer: Self-pay | Admitting: Obstetrics & Gynecology

## 2020-12-07 ENCOUNTER — Other Ambulatory Visit: Payer: Self-pay

## 2020-12-07 ENCOUNTER — Ambulatory Visit (INDEPENDENT_AMBULATORY_CARE_PROVIDER_SITE_OTHER): Payer: Medicare HMO | Admitting: Obstetrics & Gynecology

## 2020-12-07 VITALS — BP 126/78 | Ht 67.0 in | Wt 128.0 lb

## 2020-12-07 DIAGNOSIS — M8589 Other specified disorders of bone density and structure, multiple sites: Secondary | ICD-10-CM

## 2020-12-07 DIAGNOSIS — Z01419 Encounter for gynecological examination (general) (routine) without abnormal findings: Secondary | ICD-10-CM

## 2020-12-07 DIAGNOSIS — Z780 Asymptomatic menopausal state: Secondary | ICD-10-CM

## 2020-12-07 NOTE — Progress Notes (Signed)
MEKO BELLANGER [birth year] [id number]   History:    75 y.o. G1P1L1 Divorced.   RP:  Established patient presenting for annual gyn exam    HPI: Postmenopause, well on no HRT.  No PMB.  No pelvic pain.  Abstinent x many years.  Pap neg 10/2017.  Breasts normal.  Mammo 12/2019.  Urine/BMs normal.  BMI 20.05.  Yoga and walking.  Health labs with family physician.  Treating vertigo.  Colono 2017.  BD 08/2016 Osteopenia.  Past medical history,surgical history, family history and social history were all reviewed and documented in the EPIC chart.  Gynecologic History No LMP recorded. Patient is postmenopausal.  Obstetric History OB History  Gravida Para Term Preterm AB Living  1 1       1   SAB IAB Ectopic Multiple Live Births               # Outcome Date GA Lbr Len/2nd Weight Sex Delivery Anes PTL Lv  1 Para              ROS: A ROS was performed and pertinent positives and negatives are included in the history.  GENERAL: No fevers or chills. HEENT: No change in vision, no earache, sore throat or sinus congestion. NECK: No pain or stiffness. CARDIOVASCULAR: No chest pain or pressure. No palpitations. PULMONARY: No shortness of breath, cough or wheeze. GASTROINTESTINAL: No abdominal pain, nausea, vomiting or diarrhea, melena or bright red blood per rectum. GENITOURINARY: No urinary frequency, urgency, hesitancy or dysuria. MUSCULOSKELETAL: No joint or muscle pain, no back pain, no recent trauma. DERMATOLOGIC: No rash, no itching, no lesions. ENDOCRINE: No polyuria, polydipsia, no heat or cold intolerance. No recent change in weight. HEMATOLOGICAL: No anemia or easy bruising or bleeding. NEUROLOGIC: No headache, seizures, numbness, tingling or weakness. PSYCHIATRIC: No depression, no loss of interest in normal activity or change in sleep pattern.     Exam:   BP 126/78 (BP Location: Right Arm, Patient Position: Sitting, Cuff Size: Normal)   Ht 5\' 7"  (1.702 m)   Wt 128 lb (58.1 kg)   BMI 20.05  kg/m   Body mass index is 20.05 kg/m.  General appearance : Well developed well nourished female. No acute distress HEENT: Eyes: no retinal hemorrhage or exudates,  Neck supple, trachea midline, no carotid bruits, no thyroidmegaly Lungs: Clear to auscultation, no rhonchi or wheezes, or rib retractions  Heart: Regular rate and rhythm, no murmurs or gallops Breast:Examined in sitting and supine position were symmetrical in appearance, no palpable masses or tenderness,  no skin retraction, no nipple inversion, no nipple discharge, no skin discoloration, no axillary or supraclavicular lymphadenopathy Abdomen: no palpable masses or tenderness, no rebound or guarding Extremities: no edema or skin discoloration or tenderness  Pelvic: Vulva: Normal             Vagina: No gross lesions or discharge  Cervix: No gross lesions or discharge  Uterus  AV, normal size, shape and consistency, non-tender and mobile  Adnexa  Without masses or tenderness  Anus: Normal    Assessment/Plan:  75 y.o. female for annual exam   1. Well female exam with routine gynecological exam Postmenopause, well on no HRT.  No PMB.  No pelvic pain.  Abstinent x many years.  Pap neg 10/2017.  Breasts normal.  Mammo 12/2019.  Urine/BMs normal.  BMI 20.05.  Yoga and walking.  Health labs with family physician.  Treating vertigo.  Colono 2017.  BD 08/2016 Osteopenia.  2.  Postmenopausal Well on no HRT.  No PMB.   - DG Bone Density; Future  3. Osteopenia of multiple sites Repeat BD now. Ca++ 1.5 g/d total, Vit D supplements.  Regular weight bearing physical activities. - DG Bone Density; Future   Princess Bruins MD, 9:08 AM 12/07/2020

## 2020-12-08 ENCOUNTER — Encounter: Payer: Self-pay | Admitting: Obstetrics & Gynecology

## 2020-12-10 ENCOUNTER — Other Ambulatory Visit: Payer: Self-pay | Admitting: Family Medicine

## 2020-12-10 DIAGNOSIS — E039 Hypothyroidism, unspecified: Secondary | ICD-10-CM

## 2021-01-06 DIAGNOSIS — L570 Actinic keratosis: Secondary | ICD-10-CM | POA: Diagnosis not present

## 2021-01-12 DIAGNOSIS — H43813 Vitreous degeneration, bilateral: Secondary | ICD-10-CM | POA: Diagnosis not present

## 2021-01-12 DIAGNOSIS — H0102B Squamous blepharitis left eye, upper and lower eyelids: Secondary | ICD-10-CM | POA: Diagnosis not present

## 2021-01-12 DIAGNOSIS — H0102A Squamous blepharitis right eye, upper and lower eyelids: Secondary | ICD-10-CM | POA: Diagnosis not present

## 2021-01-12 DIAGNOSIS — Z961 Presence of intraocular lens: Secondary | ICD-10-CM | POA: Diagnosis not present

## 2021-01-12 DIAGNOSIS — H2512 Age-related nuclear cataract, left eye: Secondary | ICD-10-CM | POA: Diagnosis not present

## 2021-01-12 DIAGNOSIS — H02052 Trichiasis without entropian right lower eyelid: Secondary | ICD-10-CM | POA: Diagnosis not present

## 2021-01-12 DIAGNOSIS — L821 Other seborrheic keratosis: Secondary | ICD-10-CM | POA: Diagnosis not present

## 2021-01-19 ENCOUNTER — Other Ambulatory Visit: Payer: Self-pay | Admitting: Obstetrics & Gynecology

## 2021-01-19 DIAGNOSIS — Z1231 Encounter for screening mammogram for malignant neoplasm of breast: Secondary | ICD-10-CM

## 2021-02-18 ENCOUNTER — Ambulatory Visit
Admission: RE | Admit: 2021-02-18 | Discharge: 2021-02-18 | Disposition: A | Discharge status: Home / Self Care | Payer: Medicare HMO | Source: Ambulatory Visit | Attending: Obstetrics & Gynecology | Admitting: Obstetrics & Gynecology

## 2021-02-18 DIAGNOSIS — Z1231 Encounter for screening mammogram for malignant neoplasm of breast: Secondary | ICD-10-CM

## 2021-02-19 ENCOUNTER — Other Ambulatory Visit: Payer: Self-pay | Admitting: Obstetrics & Gynecology

## 2021-02-19 DIAGNOSIS — R928 Other abnormal and inconclusive findings on diagnostic imaging of breast: Secondary | ICD-10-CM

## 2021-03-23 ENCOUNTER — Other Ambulatory Visit: Payer: Self-pay

## 2021-03-23 ENCOUNTER — Ambulatory Visit
Admission: RE | Admit: 2021-03-23 | Discharge: 2021-03-23 | Disposition: A | Discharge status: Home / Self Care | Payer: Medicare HMO | Source: Ambulatory Visit | Attending: Obstetrics & Gynecology | Admitting: Obstetrics & Gynecology

## 2021-03-23 ENCOUNTER — Ambulatory Visit: Payer: Medicare HMO

## 2021-03-23 DIAGNOSIS — R922 Inconclusive mammogram: Secondary | ICD-10-CM | POA: Diagnosis not present

## 2021-03-23 DIAGNOSIS — R928 Other abnormal and inconclusive findings on diagnostic imaging of breast: Secondary | ICD-10-CM

## 2021-04-16 ENCOUNTER — Encounter: Payer: Medicare HMO | Admitting: Family Medicine

## 2021-05-12 ENCOUNTER — Ambulatory Visit (INDEPENDENT_AMBULATORY_CARE_PROVIDER_SITE_OTHER): Payer: Medicare HMO

## 2021-05-12 VITALS — Ht 67.0 in | Wt 128.0 lb

## 2021-05-12 DIAGNOSIS — Z0000 Encounter for general adult medical examination without abnormal findings: Secondary | ICD-10-CM

## 2021-05-12 NOTE — Progress Notes (Signed)
? ?Subjective:  ? Pamela Henderson is a 76 y.o. female who presents for Medicare Annual (Subsequent) preventive examination. ?Virtual Visit via Telephone Note ? ?I connected with  Pamela Henderson on 05/12/21 at  2:30 PM EDT by telephone and verified that I am speaking with the correct person using two identifiers. ? ?Location: ?Patient: HOME ?Provider: [facility] ?Persons participating in the virtual visit: patient/Nurse Health Advisor ?  ?I discussed the limitations, risks, security and privacy concerns of performing an evaluation and management service by telephone and the availability of in person appointments. The patient expressed understanding and agreed to proceed. ? ?Interactive audio and video telecommunications were attempted between this nurse and patient, however failed, due to patient having technical difficulties OR patient did not have access to video capability.  We continued and completed visit with audio only. ? ?Some vital signs may be absent or patient reported.  ? ?Chriss Driver, LPN ? ?Review of Systems    ? ?Cardiac Risk Factors include: advanced age (>94mn, >>31women);dyslipidemia;sedentary lifestyle ? ?   ?Objective:  ?  ?Today's Vitals  ? 05/12/21 1432  ?Weight: 128 lb (58.1 kg)  ?Height: '5\' 7"'$  (1.702 m)  ? ?Body mass index is 20.05 kg/m?. ? ? ?  05/12/2021  ?  2:37 PM 04/07/2020  ?  9:56 AM 10/16/2018  ?  8:39 AM 09/25/2017  ?  8:17 AM  ?Advanced Directives  ?Does Patient Have a Medical Advance Directive? Yes No No No  ?Type of [place] will   Healthcare Power of Attorney  ?Copy of [place]? No - copy requested   No - copy requested  ?Would patient like information on creating a medical advance directive?  No - Patient declined No - Patient declined Yes (MAU/Ambulatory/Procedural Areas - Information given)  ? ? ?Current Medications (verified) ?Outpatient Encounter Medications as of 05/12/2021  ?Medication Sig  ? calcium  carbonate (OS-CAL) 600 MG TABS Take 600 mg by mouth daily with breakfast.   ? levothyroxine (SYNTHROID) 75 MCG tablet TAKE 1 TABLET BY MOUTH EVERY DAY  ? meclizine (ANTIVERT) 25 MG tablet Take 1 tablet (25 mg total) by mouth 3 (three) times daily as needed for dizziness.  ? Multiple Vitamin (MULTIVITAMIN) tablet Take 1 tablet by mouth daily.  ? Omega-3 Fatty Acids (FISH OIL) 1200 MG CAPS Take 1,200 mg by mouth daily.   ? ?No facility-administered encounter medications on file as of 05/12/2021.  ? ? ?Allergies (verified) ?Patient has no known allergies.  ? ?History: ?Past Medical History:  ?Diagnosis Date  ? Hormone disorder   ? hypothyroidism  ? Hyperlipidemia   ? Insomnia   ? Osteopenia 08/2016  ? T score -1.9 FRAX 22%/12%  ? Thyroid disease   ? hypothyroid  ? ?Past Surgical History:  ?Procedure Laterality Date  ? BREAST CYST ASPIRATION Left 2002  ? NO PAST SURGERIES    ? ?Family History  ?Problem Relation Age of Onset  ? Cancer Father 522 ?     lung- smoker  ? Hypertension Brother   ? Cancer Brother   ?     lung  ? Stroke Mother   ? Breast cancer Maternal Grandmother   ? ?Social History  ? ?Socioeconomic History  ? Marital status: Divorced  ?  Spouse name: Not on file  ? Number of children: 1  ? Years of education: 10th grade-then got her GED  ? Highest education level: GED or  equivalent  ?Occupational History  ? Not on file  ?Tobacco Use  ? Smoking status: Never  ? Smokeless tobacco: Never  ?Vaping Use  ? Vaping Use: Never used  ?Substance and Sexual Activity  ? Alcohol use: No  ? Drug use: No  ? Sexual activity: Not Currently  ?  Birth control/protection: None  ?  Comment: 1st intercourse- 16, partners- 17, divorced  ?Other Topics Concern  ? Not on file  ?Social History Narrative  ? Not on file  ? ?Social Determinants of Health  ? ?Financial Resource Strain: Low Risk   ? Difficulty of Paying Living Expenses: Not hard at all  ?Food Insecurity: No Food Insecurity  ? Worried About Charity fundraiser in the Last  Year: Never true  ? Ran Out of Food in the Last Year: Never true  ?Transportation Needs: Unknown  ? Lack of Transportation (Medical): No  ? Lack of Transportation (Non-Medical): Not on file  ?Physical Activity: Sufficiently Active  ? Days of Exercise per Week: 5 days  ? Minutes of Exercise per Session: 30 min  ?Stress: No Stress Concern Present  ? Feeling of Stress : Not at all  ?Social Connections: Moderately Integrated  ? Frequency of Communication with Friends and Family: More than three times a week  ? Frequency of Social Gatherings with Friends and Family: More than three times a week  ? Attends Religious Services: 1 to 4 times per year  ? Active Member of Clubs or Organizations: Yes  ? Attends Archivist Meetings: 1 to 4 times per year  ? Marital Status: Divorced  ? ? ?Tobacco Counseling ?Counseling given: Not Answered ? ? ?Clinical Intake: ? ?Pre-visit preparation completed: Yes ? ?Pain : No/denies pain ? ?  ? ?BMI - recorded: 20.05 ?Nutritional Status: BMI of 19-24  Normal ?Nutritional Risks: None ?Diabetes: No ? ?How often do you need to have someone help you when you read instructions, pamphlets, or other written materials from your doctor or pharmacy?: 1 - Never ? ?Diabetic?NO ? ?Interpreter Needed?: No ? ?Information entered by :: mj Obera Stauch, lpn ? ? ?Activities of Daily Living ? ?  05/12/2021  ?  2:40 PM  ?In your present state of health, do you have any difficulty performing the following activities:  ?Hearing? 0  ?Vision? 0  ?Difficulty concentrating or making decisions? 0  ?Walking or climbing stairs? 0  ?Dressing or bathing? 0  ?Doing errands, shopping? 0  ?Preparing Food and eating ? N  ?Using the Toilet? N  ?In the past six months, have you accidently leaked urine? N  ?Do you have problems with loss of bowel control? N  ?Managing your Medications? N  ?Managing your Finances? N  ?Housekeeping or managing your Housekeeping? N  ? ? ?Patient Care Team: ?Janora Norlander, DO as PCP - General  (Family Medicine) ? ?Indicate any recent Medical Services you may have received from other than [facility] providers in the past year (date may be approximate). ? ?   ?Assessment:  ? This is a routine wellness examination for [facility]. ? ?Hearing/Vision screen ?Hearing Screening - Comments:: No hearing issues.  ? ?Vision Screening - Comments:: Readers. Dr. Katy Fitch ? 2022 ? ?Dietary issues and exercise activities discussed: ?Current Exercise Habits: Home exercise routine, Type of exercise: walking, Time (Minutes): 30, Frequency (Times/Week): 5, Weekly Exercise (Minutes/Week): 150, Intensity: Mild, Exercise limited by: cardiac condition(s);orthopedic condition(s) ? ? Goals Addressed   ?None ?  ? ?Depression Screen ? ?  05/12/2021  ?  2:34 PM 10/01/2020  ? 10:57 AM 04/15/2020  ?  2:00 PM 04/07/2020  ?  9:56 AM 12/04/2019  ?  2:55 PM 07/16/2019  ?  8:12 AM 10/16/2018  ?  8:40 AM  ?PHQ 2/9 Scores  ?PHQ - 2 Score 0 0 0 0 0 0 0  ?PHQ- 9 Score   0      ?  ?Fall Risk ? ?  05/12/2021  ?  2:37 PM 10/01/2020  ? 10:57 AM 04/15/2020  ?  2:00 PM 04/07/2020  ?  9:56 AM 12/04/2019  ?  2:54 PM  ?Fall Risk   ?Falls in the past year? 0 0 0 0 0  ?Number falls in past yr: 0      ?Injury with Fall? 0      ?Risk for fall due to : No Fall Risks      ?Follow up Falls prevention discussed    Falls evaluation completed  ? ? ?FALL RISK PREVENTION PERTAINING TO THE HOME: ? ?Any stairs in or around the home? Yes  ?If so, are there any without handrails? Yes  ?Home free of loose throw rugs in walkways, pet beds, electrical cords, etc? Yes  ?Adequate lighting in your home to reduce risk of falls? Yes  ? ?ASSISTIVE DEVICES UTILIZED TO PREVENT FALLS: ? ?Life alert? No  ?Use of a cane, walker or w/c? No  ?Grab bars in the bathroom? No  ?Shower chair or bench in shower? No  ?Elevated toilet seat or a handicapped toilet? Yes  ? ?TIMED UP AND GO: ? ?Was the test performed? No .  ?Phone visit. ? ?Cognitive Function: Normal cognitive status assessed over phone by this Nurse Health  Advisor. No abnormalities found.  ?Pt answered all questions appropriately.  ? ? ?  09/25/2017  ?  8:21 AM  ?MMSE - Mini Mental State Exam  ?Orientation to time 5  ?Orientation to Place 5  ?Registration 3  ?Attent

## 2021-05-12 NOTE — Patient Instructions (Signed)
Pamela Henderson , ?Thank you for taking time to come for your Medicare Wellness Visit. I appreciate your ongoing commitment to your health goals. Please review the following plan we discussed and let me know if I can assist you in the future.  ? ?Screening recommendations/referrals: ?Colonoscopy: Done 01/19/2016 No longer required. ?Mammogram: Done 03/23/2021 Repeat annually ? ?Bone Density: Done 09/06/2016 Repeat every 2 years ? ?Recommended yearly ophthalmology/optometry visit for glaucoma screening and checkup ?Recommended yearly dental visit for hygiene and checkup ? ?Vaccinations: ?Influenza vaccine: Done 11/27/2020 Repeat annually ? ?Pneumococcal vaccine: Discussed. ?Tdap vaccine: Due Repeat in 10 years ? ?Shingles vaccine: Discussed.   ?Covid-19:Done 04/12/2019 and 05/15/2019 ? ?Advanced directives: Please bring a copy of your health care power of attorney and living will to the office to be added to your chart at your convenience. ? ? ?Conditions/risks identified: Aim for 30 minutes of exercise or brisk walking, 6-8 glasses of water, and 5 servings of fruits and vegetables each day. ?KEEP UP THE GOOD WORK!! ? ?Next appointment: Follow up in one year for your annual wellness visit 2024. ? ? ?Preventive Care 78 Years and Older, Female ?Preventive care refers to lifestyle choices and visits with your health care provider that can promote health and wellness. ?What does preventive care include? ?A yearly physical exam. This is also called an annual well check. ?Dental exams once or twice a year. ?Routine eye exams. Ask your health care provider how often you should have your eyes checked. ?Personal lifestyle choices, including: ?Daily care of your teeth and gums. ?Regular physical activity. ?Eating a healthy diet. ?Avoiding tobacco and drug use. ?Limiting alcohol use. ?Practicing safe sex. ?Taking low-dose aspirin every day. ?Taking vitamin and mineral supplements as recommended by your health care provider. ?What happens  during an annual well check? ?The services and screenings done by your health care provider during your annual well check will depend on your age, overall health, lifestyle risk factors, and family history of disease. ?Counseling  ?Your health care provider may ask you questions about your: ?Alcohol use. ?Tobacco use. ?Drug use. ?Emotional well-being. ?Home and relationship well-being. ?Sexual activity. ?Eating habits. ?History of falls. ?Memory and ability to understand (cognition). ?Work and work Statistician. ?Reproductive health. ?Screening  ?You may have the following tests or measurements: ?Height, weight, and BMI. ?Blood pressure. ?Lipid and cholesterol levels. These may be checked every 5 years, or more frequently if you are over 2 years old. ?Skin check. ?Lung cancer screening. You may have this screening every year starting at age 62 if you have a 30-pack-year history of smoking and currently smoke or have quit within the past 15 years. ?Fecal occult blood test (FOBT) of the stool. You may have this test every year starting at age 28. ?Flexible sigmoidoscopy or colonoscopy. You may have a sigmoidoscopy every 5 years or a colonoscopy every 10 years starting at age 54. ?Hepatitis C blood test. ?Hepatitis B blood test. ?Sexually transmitted disease (STD) testing. ?Diabetes screening. This is done by checking your blood sugar (glucose) after you have not eaten for a while (fasting). You may have this done every 1-3 years. ?Bone density scan. This is done to screen for osteoporosis. You may have this done starting at age 55. ?Mammogram. This may be done every 1-2 years. Talk to your health care provider about how often you should have regular mammograms. ?Talk with your health care provider about your test results, treatment options, and if necessary, the need for more tests. ?Vaccines  ?  Your health care provider may recommend certain vaccines, such as: ?Influenza vaccine. This is recommended every  year. ?Tetanus, diphtheria, and acellular pertussis (Tdap, Td) vaccine. You may need a Td booster every 10 years. ?Zoster vaccine. You may need this after age 5. ?Pneumococcal 13-valent conjugate (PCV13) vaccine. One dose is recommended after age 73. ?Pneumococcal polysaccharide (PPSV23) vaccine. One dose is recommended after age 25. ?Talk to your health care provider about which screenings and vaccines you need and how often you need them. ?This information is not intended to replace advice given to you by your health care provider. Make sure you discuss any questions you have with your health care provider. ?Document Released: 02/13/2015 Document Revised: 10/07/2015 Document Reviewed: 11/18/2014 ?Elsevier Interactive Patient Education ? 2017 [place]. ? ?Fall Prevention in the Home ?Falls can cause injuries. They can happen to people of all ages. There are many things you can do to make your home safe and to help prevent falls. ?What can I do on the outside of my home? ?Regularly fix the edges of walkways and driveways and fix any cracks. ?Remove anything that might make you trip as you walk through a door, such as a raised step or threshold. ?Trim any bushes or trees on the path to your home. ?Use bright outdoor lighting. ?Clear any walking paths of anything that might make someone trip, such as rocks or tools. ?Regularly check to see if handrails are loose or broken. Make sure that both sides of any steps have handrails. ?Any raised decks and porches should have guardrails on the edges. ?Have any leaves, snow, or ice cleared regularly. ?Use sand or salt on walking paths during winter. ?Clean up any spills in your garage right away. This includes oil or grease spills. ?What can I do in the bathroom? ?Use night lights. ?Install grab bars by the toilet and in the tub and shower. Do not use towel bars as grab bars. ?Use non-skid mats or decals in the tub or shower. ?If you need to sit down in the shower, use a  plastic, non-slip stool. ?Keep the floor dry. Clean up any water that spills on the floor as soon as it happens. ?Remove soap buildup in the tub or shower regularly. ?Attach bath mats securely with double-sided non-slip rug tape. ?Do not have throw rugs and other things on the floor that can make you trip. ?What can I do in the bedroom? ?Use night lights. ?Make sure that you have a light by your bed that is easy to reach. ?Do not use any sheets or blankets that are too big for your bed. They should not hang down onto the floor. ?Have a firm chair that has side arms. You can use this for support while you get dressed. ?Do not have throw rugs and other things on the floor that can make you trip. ?What can I do in the kitchen? ?Clean up any spills right away. ?Avoid walking on wet floors. ?Keep items that you use a lot in easy-to-reach places. ?If you need to reach something above you, use a strong step stool that has a grab bar. ?Keep electrical cords out of the way. ?Do not use floor polish or wax that makes floors slippery. If you must use wax, use non-skid floor wax. ?Do not have throw rugs and other things on the floor that can make you trip. ?What can I do with my stairs? ?Do not leave any items on the stairs. ?Make sure that there are  handrails on both sides of the stairs and use them. Fix handrails that are broken or loose. Make sure that handrails are as long as the stairways. ?Check any carpeting to make sure that it is firmly attached to the stairs. Fix any carpet that is loose or worn. ?Avoid having throw rugs at the top or bottom of the stairs. If you do have throw rugs, attach them to the floor with carpet tape. ?Make sure that you have a light switch at the top of the stairs and the bottom of the stairs. If you do not have them, ask someone to add them for you. ?What else can I do to help prevent falls? ?Wear shoes that: ?Do not have high heels. ?Have rubber bottoms. ?Are comfortable and fit you  well. ?Are closed at the toe. Do not wear sandals. ?If you use a stepladder: ?Make sure that it is fully opened. Do not climb a closed stepladder. ?Make sure that both sides of the stepladder are locked into place. ?Ask s

## 2021-06-14 ENCOUNTER — Other Ambulatory Visit: Payer: Self-pay | Admitting: Family Medicine

## 2021-06-14 ENCOUNTER — Telehealth: Payer: Self-pay | Admitting: Family Medicine

## 2021-06-14 DIAGNOSIS — E039 Hypothyroidism, unspecified: Secondary | ICD-10-CM

## 2021-06-14 NOTE — Telephone Encounter (Signed)
[facility] NTBS Last PE & Labs 03/2020 ?

## 2021-06-14 NOTE — Telephone Encounter (Signed)
Pt wants to switch pcp to [facility]. Pt aware to schedule an apt for med refills after decision is made. ?

## 2021-06-14 NOTE — Telephone Encounter (Signed)
Ok with me 

## 2021-06-15 NOTE — Telephone Encounter (Signed)
Please schedule next available with Sharyn Lull. Needs to be 30 minutes since pt will be new to [place].  ?

## 2021-06-17 NOTE — Telephone Encounter (Signed)
APPT SCHEDULED WITH MICHELLE [facility] 06/17/2021.

## 2021-06-18 ENCOUNTER — Ambulatory Visit (INDEPENDENT_AMBULATORY_CARE_PROVIDER_SITE_OTHER): Payer: Medicare HMO | Admitting: Family Medicine

## 2021-06-18 ENCOUNTER — Encounter: Payer: Self-pay | Admitting: Family Medicine

## 2021-06-18 VITALS — BP 117/72 | HR 66 | Temp 98.0°F | Ht 67.0 in | Wt 127.0 lb

## 2021-06-18 DIAGNOSIS — E782 Mixed hyperlipidemia: Secondary | ICD-10-CM | POA: Diagnosis not present

## 2021-06-18 DIAGNOSIS — E039 Hypothyroidism, unspecified: Secondary | ICD-10-CM

## 2021-06-18 DIAGNOSIS — M85852 Other specified disorders of bone density and structure, left thigh: Secondary | ICD-10-CM

## 2021-06-18 MED ORDER — KETOROLAC TROMETHAMINE 30 MG/ML IJ SOLN
30.0000 mg | Freq: Once | INTRAMUSCULAR | Status: AC
  Administered 2021-06-18: 30 mg via INTRAMUSCULAR

## 2021-06-18 NOTE — Progress Notes (Signed)
Subjective:  Patient ID: Pamela Henderson, female    DOB: [birth year], 76 y.o.   MRN: [id number]  Patient Care Team: Baruch Gouty, FNP as PCP - General (Family Medicine)   Chief Complaint:  Medication Refill   HPI: Pamela Henderson is a 76 y.o. female presenting on 06/18/2021 for Medication Refill   1. Acquired hypothyroidism Pt has been on 75 mcg levothyroxine for several months. Denies hyper- or hypothyroid symptoms. No hair, skin, nail, or bowel habit changes. No heat or cold intolerance. No weight changes.   2. Osteopenia of neck of left femur On [place]. Walks and stays active daily. No recent fracture. Last DEXA 2018. Does not wish to have repeat DEXA today. No arthralgias or trouble walking.   3. Mixed hyperlipidemia On Omega-3. Does try to follow a healthy diet. Active on a daily basis. Last lipid panel 2020.     Relevant past medical, surgical, family, and social history reviewed and updated as indicated.  Allergies and medications reviewed and updated. Data reviewed: Chart in Epic.   Past Medical History:  Diagnosis Date   Hormone disorder    hypothyroidism   Hyperlipidemia    Insomnia    Osteopenia 08/2016   T score -1.9 FRAX 22%/12%   Thyroid disease    hypothyroid    Past Surgical History:  Procedure Laterality Date   BREAST CYST ASPIRATION Left 2002   NO PAST SURGERIES      Social History   Socioeconomic History   Marital status: Divorced    Spouse name: Not on file   Number of children: 1   Years of education: 10th grade-then got her GED   Highest education level: GED or equivalent  Occupational History   Not on file  Tobacco Use   Smoking status: Never   Smokeless tobacco: Never  Vaping Use   Vaping Use: Never used  Substance and Sexual Activity   Alcohol use: No   Drug use: No   Sexual activity: Not Currently    Birth control/protection: None    Comment: 1st intercourse- 16, partners- 62, divorced  Other Topics Concern   Not on file   Social History Narrative   Not on file   Social Determinants of Health   Financial Resource Strain: Low Risk    Difficulty of Paying Living Expenses: Not hard at all  Food Insecurity: No Food Insecurity   Worried About Charity fundraiser in the Last Year: Never true   [place] in the Last Year: Never true  Transportation Needs: Unknown   Lack of Transportation (Medical): No   Lack of Transportation (Non-Medical): Not on file  Physical Activity: Sufficiently Active   Days of Exercise per Week: 5 days   Minutes of Exercise per Session: 30 min  Stress: No Stress Concern Present   Feeling of Stress : Not at all  Social Connections: Moderately Integrated   Frequency of Communication with Friends and Family: More than three times a week   Frequency of Social Gatherings with Friends and Family: More than three times a week   Attends Religious Services: 1 to 4 times per year   Active Member of [employer] or Organizations: Yes   Attends Archivist Meetings: 1 to 4 times per year   Marital Status: Divorced  Human resources officer Violence: Not At Risk   Fear of Current or Ex-Partner: No   Emotionally Abused: No   Physically Abused: No   Sexually Abused:  No    Outpatient Encounter Medications as of 06/18/2021  Medication Sig   calcium carbonate (OS-CAL) 600 MG TABS Take 600 mg by mouth daily with breakfast.    levothyroxine (SYNTHROID) 75 MCG tablet TAKE 1 TABLET BY MOUTH EVERY DAY   meclizine (ANTIVERT) 25 MG tablet Take 1 tablet (25 mg total) by mouth 3 (three) times daily as needed for dizziness.   Multiple Vitamin (MULTIVITAMIN) tablet Take 1 tablet by mouth daily.   Omega-3 Fatty Acids (FISH OIL) 1200 MG CAPS Take 1,200 mg by mouth daily.    [EXPIRED] ketorolac (TORADOL) 30 MG/ML injection 30 mg    No facility-administered encounter medications on file as of 06/18/2021.    No Known Allergies  Review of Systems  Constitutional:  Negative for activity change, appetite  change and unexpected weight change.  HENT: Negative.    Eyes: Negative.  Negative for photophobia and visual disturbance.  Respiratory:  Negative for chest tightness and shortness of breath.   Cardiovascular:  Negative for palpitations and leg swelling.  Gastrointestinal:  Negative for blood in stool, constipation and diarrhea.  Endocrine: Negative.  Negative for cold intolerance, heat intolerance, polydipsia, polyphagia and polyuria.  Genitourinary:  Negative for decreased urine volume, difficulty urinating, dysuria, frequency and urgency.  Skin: Negative.   Allergic/Immunologic: Negative.   Neurological:  Negative for dizziness, tremors, seizures, syncope, facial asymmetry, speech difficulty and light-headedness.  Hematological: Negative.   Psychiatric/Behavioral:  Negative for confusion, hallucinations, sleep disturbance and suicidal ideas.   All other systems reviewed and are negative.      Objective:  BP 117/72   Pulse 66   Temp 98 F (36.7 C)   Ht _0  (1.702 m)   Wt 127 lb (57.6 kg)   SpO2 96%   BMI 19.89 kg/m    Wt Readings from Last 3 Encounters:  06/18/21 127 lb (57.6 kg)  05/12/21 128 lb (58.1 kg)  12/07/20 128 lb (58.1 kg)    Physical Exam Vitals and nursing note reviewed.  Constitutional:      General: She is not in acute distress.    Appearance: Normal appearance. She is well-developed, well-groomed and normal weight. She is not ill-appearing, toxic-appearing or diaphoretic.  HENT:     Head: Normocephalic and atraumatic.     Jaw: There is normal jaw occlusion.     Right Ear: Hearing normal.     Left Ear: Hearing normal.     Nose: Nose normal.     Mouth/Throat:     Lips: Pink.     Mouth: Mucous membranes are moist.     Pharynx: Oropharynx is clear. Uvula midline.  Eyes:     General: Lids are normal.     Extraocular Movements: Extraocular movements intact.     Conjunctiva/sclera: Conjunctivae normal.     Pupils: Pupils are equal, round, and reactive  to light.  Neck:     Thyroid: No thyroid mass, thyromegaly or thyroid tenderness.     Vascular: No carotid bruit or JVD.     Trachea: Trachea and phonation normal.  Cardiovascular:     Rate and Rhythm: Normal rate and regular rhythm.     Chest Wall: PMI is not displaced.     Pulses: Normal pulses.     Heart sounds: Normal heart sounds. No murmur heard.   No friction rub. No gallop.  Pulmonary:     Effort: Pulmonary effort is normal. No respiratory distress.     Breath sounds: Normal breath sounds. No wheezing.  Abdominal:     General: Bowel sounds are normal. There is no distension or abdominal bruit.     Palpations: Abdomen is soft. There is no hepatomegaly or splenomegaly.     Tenderness: There is no abdominal tenderness. There is no right CVA tenderness or left CVA tenderness.     Hernia: No hernia is present.  Musculoskeletal:        General: Normal range of motion.     Cervical back: Normal range of motion and neck supple. No rigidity or tenderness.     Right lower leg: No edema.     Left lower leg: No edema.  Lymphadenopathy:     Cervical: No cervical adenopathy.  Skin:    General: Skin is warm and dry.     Capillary Refill: Capillary refill takes less than 2 seconds.     Coloration: Skin is not cyanotic, jaundiced or pale.     Findings: No rash.  Neurological:     General: No focal deficit present.     Mental Status: She is alert and oriented to person, place, and time.     Sensory: Sensation is intact.     Motor: Motor function is intact.     Coordination: Coordination is intact.     Gait: Gait is intact.     Deep Tendon Reflexes: Reflexes are normal and symmetric.  Psychiatric:        Attention and Perception: Attention and perception normal.        Mood and Affect: Mood and affect normal.        Speech: Speech normal.        Behavior: Behavior normal. Behavior is cooperative.        Thought Content: Thought content normal.        Cognition and Memory: Cognition  and memory normal.        Judgment: Judgment normal.    Results for orders placed or performed in visit on 10/01/20  [facility]  Result Value Ref Range   Glucose 77 65 - 99 mg/dL   BUN 10 8 - 27 mg/dL   Creatinine, Ser 0.73 0.57 - 1.00 mg/dL   eGFR 85 >59 mL/min/1.73   BUN/Creatinine Ratio 14 12 - 28   Sodium 143 134 - 144 mmol/L   Potassium 3.9 3.5 - 5.2 mmol/L   Chloride 106 96 - 106 mmol/L   CO2 24 20 - 29 mmol/L   Calcium 9.3 8.7 - 10.3 mg/dL  CBC with Differential/Platelet  Result Value Ref Range   WBC 6.7 3.4 - 10.8 x10E3/uL   RBC 4.63 3.77 - 5.28 x10E6/uL   Hemoglobin 13.7 11.1 - 15.9 g/dL   Hematocrit 41.8 34.0 - 46.6 %   MCV 90 79 - 97 fL   MCH 29.6 26.6 - 33.0 pg   MCHC 32.8 31.5 - 35.7 g/dL   RDW 13.2 11.7 - 15.4 %   Platelets 239 150 - 450 x10E3/uL   Neutrophils 59 Not Estab. %   Lymphs 32 Not Estab. %   Monocytes 7 Not Estab. %   Eos 1 Not Estab. %   Basos 1 Not Estab. %   Neutrophils Absolute 3.9 1.4 - 7.0 x10E3/uL   Lymphocytes Absolute 2.1 0.7 - 3.1 x10E3/uL   Monocytes Absolute 0.5 0.1 - 0.9 x10E3/uL   EOS (ABSOLUTE) 0.1 0.0 - 0.4 x10E3/uL   Basophils Absolute 0.0 0.0 - 0.2 x10E3/uL   Immature Granulocytes 0 Not Estab. %   Immature Grans (Abs) 0.0 0.0 -  0.1 x10E3/uL       Pertinent labs & imaging results that were available during my care of the patient were reviewed by me and considered in my medical decision making.  Assessment & Plan:  Mileah was seen today for medication refill.  Diagnoses and all orders for this visit:  Acquired hypothyroidism Will recheck labs and adjust repletion therapy if warranted. If regimen is adjusted, pt will follow up for repeat labs in 3 months. If regimen stays the same, will recheck labs in 6 months.  -     Thyroid Panel With TSH  Osteopenia of neck of left femur On [place]. Will check labs today. Does not want to repeat DEXA today, will repeat at next visit.  -     CBC with Differential/Platelet -      CMP14+EGFR  Mixed hyperlipidemia On omega-3. Labs pending. Diet and exercise encouraged.  -     CMP14+EGFR -     Lipid panel     Continue all other maintenance medications.  Follow up plan: Return in about 6 months (around 12/19/2021), or if symptoms worsen or fail to improve.   Continue healthy lifestyle choices, including diet (rich in fruits, vegetables, and lean proteins, and low in salt and simple carbohydrates) and exercise (at least 30 minutes of moderate physical activity daily).   The above assessment and management plan was discussed with the patient. The patient verbalized understanding of and has agreed to the management plan. Patient is aware to call the clinic if they develop any new symptoms or if symptoms persist or worsen. Patient is aware when to return to the clinic for a follow-up visit. Patient educated on when it is appropriate to go to the emergency department.   Monia Pouch, FNP-C [facility] [phone]

## 2021-06-19 LAB — CBC WITH DIFFERENTIAL/PLATELET
Basophils Absolute: 0 10*3/uL (ref 0.0–0.2)
Basos: 1 %
EOS (ABSOLUTE): 0.1 10*3/uL (ref 0.0–0.4)
Eos: 1 %
Hematocrit: 43.4 % (ref 34.0–46.6)
Hemoglobin: 14 g/dL (ref 11.1–15.9)
Immature Grans (Abs): 0 10*3/uL (ref 0.0–0.1)
Immature Granulocytes: 0 %
Lymphocytes Absolute: 2.5 10*3/uL (ref 0.7–3.1)
Lymphs: 39 %
MCH: 29.5 pg (ref 26.6–33.0)
MCHC: 32.3 g/dL (ref 31.5–35.7)
MCV: 91 fL (ref 79–97)
Monocytes Absolute: 0.5 10*3/uL (ref 0.1–0.9)
Monocytes: 7 %
Neutrophils Absolute: 3.4 10*3/uL (ref 1.4–7.0)
Neutrophils: 52 %
Platelets: 236 10*3/uL (ref 150–450)
RBC: 4.75 x10E6/uL (ref 3.77–5.28)
RDW: 12.6 % (ref 11.7–15.4)
WBC: 6.5 10*3/uL (ref 3.4–10.8)

## 2021-06-19 LAB — CMP14+EGFR
ALT: 12 IU/L (ref 0–32)
AST: 19 IU/L (ref 0–40)
Albumin/Globulin Ratio: 2 (ref 1.2–2.2)
Albumin: 4.4 g/dL (ref 3.7–4.7)
Alkaline Phosphatase: 86 IU/L (ref 44–121)
BUN/Creatinine Ratio: 20 (ref 12–28)
BUN: 14 mg/dL (ref 8–27)
Bilirubin Total: 0.2 mg/dL (ref 0.0–1.2)
CO2: 23 mmol/L (ref 20–29)
Calcium: 9.3 mg/dL (ref 8.7–10.3)
Chloride: 104 mmol/L (ref 96–106)
Creatinine, Ser: 0.7 mg/dL (ref 0.57–1.00)
Globulin, Total: 2.2 g/dL (ref 1.5–4.5)
Glucose: 94 mg/dL (ref 70–99)
Potassium: 4.4 mmol/L (ref 3.5–5.2)
Sodium: 142 mmol/L (ref 134–144)
Total Protein: 6.6 g/dL (ref 6.0–8.5)
eGFR: 89 mL/min/{1.73_m2} (ref >59)

## 2021-06-19 LAB — LIPID PANEL
Chol/HDL Ratio: 3.8 ratio (ref 0.0–4.4)
Cholesterol, Total: 242 mg/dL — ABNORMAL HIGH (ref 100–199)
HDL: 64 mg/dL (ref >39)
LDL Chol Calc (NIH): 158 mg/dL — ABNORMAL HIGH (ref 0–99)
Triglycerides: 112 mg/dL (ref 0–149)
VLDL Cholesterol Cal: 20 mg/dL (ref 5–40)

## 2021-06-19 LAB — THYROID PANEL WITH TSH
Free Thyroxine Index: 2.7 (ref 1.2–4.9)
T3 Uptake Ratio: 28 % (ref 24–39)
T4, Total: 9.5 ug/dL (ref 4.5–12.0)
TSH: 1.53 u[IU]/mL (ref 0.450–4.500)

## 2021-06-20 MED ORDER — LEVOTHYROXINE SODIUM 75 MCG PO TABS: 75.0000 ug | ORAL_TABLET | Freq: Every day | ORAL | 2 refills | Status: DC

## 2021-06-20 NOTE — Addendum Note (Signed)
Addended by: Baruch Gouty on: 06/20/2021 09:48 PM   Modules accepted: Orders

## 2021-07-07 DIAGNOSIS — L570 Actinic keratosis: Secondary | ICD-10-CM | POA: Diagnosis not present

## 2021-07-07 DIAGNOSIS — Z85828 Personal history of other malignant neoplasm of skin: Secondary | ICD-10-CM | POA: Diagnosis not present

## 2021-07-07 DIAGNOSIS — Z1283 Encounter for screening for malignant neoplasm of skin: Secondary | ICD-10-CM | POA: Diagnosis not present

## 2021-08-05 ENCOUNTER — Ambulatory Visit (INDEPENDENT_AMBULATORY_CARE_PROVIDER_SITE_OTHER): Payer: Medicare HMO | Admitting: Family Medicine

## 2021-08-05 ENCOUNTER — Ambulatory Visit (INDEPENDENT_AMBULATORY_CARE_PROVIDER_SITE_OTHER): Payer: Medicare HMO

## 2021-08-05 ENCOUNTER — Encounter: Payer: Self-pay | Admitting: Family Medicine

## 2021-08-05 VITALS — BP 107/72 | HR 88 | Temp 98.5°F | Ht 67.0 in | Wt 125.0 lb

## 2021-08-05 DIAGNOSIS — R9431 Abnormal electrocardiogram [ECG] [EKG]: Secondary | ICD-10-CM

## 2021-08-05 DIAGNOSIS — R0602 Shortness of breath: Secondary | ICD-10-CM

## 2021-08-05 DIAGNOSIS — R918 Other nonspecific abnormal finding of lung field: Secondary | ICD-10-CM | POA: Diagnosis not present

## 2021-08-05 NOTE — Progress Notes (Signed)
Subjective:  Patient ID: Pamela Henderson, female    DOB: [birth year], 76 y.o.   MRN: [id number]  Patient Care Team: Baruch Gouty, FNP as PCP - General (Family Medicine)   Chief Complaint:  Shortness of breath f/u   HPI: Pamela Henderson is a 76 y.o. female presenting on 08/05/2021 for Shortness of breath f/u   Pt presents today with complaints of mild shortness of breath with exertion. States this only occurs when active and is relieved by rest. She does admit to living a sedentary lifestyle. No recent travel, illnesses, or procedures. No associated chest pain, palpitations, dizziness, or syncope. No leg swelling, orthopnea, PND, diaphoresis, nausea, or vomiting.      Relevant past medical, surgical, family, and social history reviewed and updated as indicated.  Allergies and medications reviewed and updated. Data reviewed: Chart in Epic.   Past Medical History:  Diagnosis Date   Hormone disorder    hypothyroidism   Hyperlipidemia    Insomnia    Osteopenia 08/2016   T score -1.9 FRAX 22%/12%   Thyroid disease    hypothyroid    Past Surgical History:  Procedure Laterality Date   BREAST CYST ASPIRATION Left 2002   NO PAST SURGERIES      Social History   Socioeconomic History   Marital status: Divorced    Spouse name: Not on file   Number of children: 1   Years of education: 10th grade-then got her GED   Highest education level: GED or equivalent  Occupational History   Not on file  Tobacco Use   Smoking status: Never   Smokeless tobacco: Never  Vaping Use   Vaping Use: Never used  Substance and Sexual Activity   Alcohol use: No   Drug use: No   Sexual activity: Not Currently    Birth control/protection: None    Comment: 1st intercourse- 16, partners- 59, divorced  Other Topics Concern   Not on file  Social History Narrative   Not on file   Social Determinants of Health   Financial Resource Strain: Low Risk  (05/12/2021)   Overall Financial Resource Strain  (CARDIA)    Difficulty of Paying Living Expenses: Not hard at all  Food Insecurity: No Food Insecurity (05/12/2021)   Hunger Vital Sign    Worried About Running Out of Food in the Last Year: Never true    [place] in the Last Year: Never true  Transportation Needs: Unknown (05/12/2021)   PRAPARE - Hydrologist (Medical): No    Lack of Transportation (Non-Medical): Not on file  Physical Activity: Sufficiently Active (05/12/2021)   Exercise Vital Sign    Days of Exercise per Week: 5 days    Minutes of Exercise per Session: 30 min  Stress: No Stress Concern Present (05/12/2021)   [place]    Feeling of Stress : Not at all  Social Connections: Moderately Integrated (05/12/2021)   Social Connection and Isolation Panel [NHANES]    Frequency of Communication with Friends and Family: More than three times a week    Frequency of Social Gatherings with Friends and Family: More than three times a week    Attends Religious Services: 1 to 4 times per year    Active Member of [employer] or Organizations: Yes    Attends Archivist Meetings: 1 to 4 times per year    Marital Status: Divorced  Intimate Partner Violence: Not At Risk (05/12/2021)   Humiliation, Afraid, Rape, and Kick questionnaire    Fear of Current or Ex-Partner: No    Emotionally Abused: No    Physically Abused: No    Sexually Abused: No    Outpatient Encounter Medications as of 08/05/2021  Medication Sig   calcium carbonate (OS-CAL) 600 MG TABS Take 600 mg by mouth daily with breakfast.    levothyroxine (SYNTHROID) 75 MCG tablet Take 1 tablet (75 mcg total) by mouth daily.   meclizine (ANTIVERT) 25 MG tablet Take 1 tablet (25 mg total) by mouth 3 (three) times daily as needed for dizziness.   [DISCONTINUED] Multiple Vitamin (MULTIVITAMIN) tablet Take 1 tablet by mouth daily.   [DISCONTINUED] Omega-3 Fatty Acids (FISH OIL) 1200 MG  CAPS Take 1,200 mg by mouth daily.    No facility-administered encounter medications on file as of 08/05/2021.    No Known Allergies  Review of Systems  Constitutional:  Negative for activity change, appetite change, chills, diaphoresis, fatigue, fever and unexpected weight change.  Respiratory:  Positive for shortness of breath. Negative for apnea, cough, choking, chest tightness, wheezing and stridor.   Cardiovascular:  Negative for chest pain, palpitations and leg swelling.  Gastrointestinal:  Negative for abdominal pain, anal bleeding and blood in stool.  Genitourinary:  Negative for hematuria and vaginal bleeding.  Musculoskeletal:  Negative for arthralgias and myalgias.  Skin:  Negative for color change.  Neurological:  Negative for dizziness, tremors, seizures, syncope, facial asymmetry, speech difficulty, weakness, light-headedness, numbness and headaches.  Psychiatric/Behavioral:  Negative for confusion.   All other systems reviewed and are negative.       Objective:  BP 107/72   Pulse 88   Temp 98.5 F (36.9 C)   Ht '5\' 7"'  (1.702 m)   Wt 125 lb (56.7 kg)   SpO2 96%   BMI 19.58 kg/m    Wt Readings from Last 3 Encounters:  08/05/21 125 lb (56.7 kg)  06/18/21 127 lb (57.6 kg)  05/12/21 128 lb (58.1 kg)    Physical Exam Vitals and nursing note reviewed.  Constitutional:      General: She is not in acute distress.    Appearance: Normal appearance. She is normal weight. She is not ill-appearing, toxic-appearing or diaphoretic.  HENT:     Head: Normocephalic and atraumatic.  Eyes:     Conjunctiva/sclera: Conjunctivae normal.     Pupils: Pupils are equal, round, and reactive to light.  Cardiovascular:     Rate and Rhythm: Normal rate and regular rhythm.     Heart sounds: Normal heart sounds. No murmur heard.    No friction rub. No gallop.  Pulmonary:     Effort: Pulmonary effort is normal.     Breath sounds: Normal breath sounds. No rhonchi or rales.  Abdominal:      General: Bowel sounds are normal.     Palpations: Abdomen is soft.  Musculoskeletal:     Right lower leg: No edema.     Left lower leg: No edema.  Skin:    General: Skin is warm and dry.     Capillary Refill: Capillary refill takes less than 2 seconds.  Neurological:     General: No focal deficit present.     Mental Status: She is alert and oriented to person, place, and time.  Psychiatric:        Mood and Affect: Mood normal.        Behavior: Behavior normal.  Thought Content: Thought content normal.        Judgment: Judgment normal.     Results for orders placed or performed in visit on 06/18/21  CBC with Differential/Platelet  Result Value Ref Range   WBC 6.5 3.4 - 10.8 x10E3/uL   RBC 4.75 3.77 - 5.28 x10E6/uL   Hemoglobin 14.0 11.1 - 15.9 g/dL   Hematocrit 43.4 34.0 - 46.6 %   MCV 91 79 - 97 fL   MCH 29.5 26.6 - 33.0 pg   MCHC 32.3 31.5 - 35.7 g/dL   RDW 12.6 11.7 - 15.4 %   Platelets 236 150 - 450 x10E3/uL   Neutrophils 52 Not Estab. %   Lymphs 39 Not Estab. %   Monocytes 7 Not Estab. %   Eos 1 Not Estab. %   Basos 1 Not Estab. %   Neutrophils Absolute 3.4 1.4 - 7.0 x10E3/uL   Lymphocytes Absolute 2.5 0.7 - 3.1 x10E3/uL   Monocytes Absolute 0.5 0.1 - 0.9 x10E3/uL   EOS (ABSOLUTE) 0.1 0.0 - 0.4 x10E3/uL   Basophils Absolute 0.0 0.0 - 0.2 x10E3/uL   Immature Granulocytes 0 Not Estab. %   Immature Grans (Abs) 0.0 0.0 - 0.1 x10E3/uL  CMP14+EGFR  Result Value Ref Range   Glucose 94 70 - 99 mg/dL   BUN 14 8 - 27 mg/dL   Creatinine, Ser 0.70 0.57 - 1.00 mg/dL   eGFR 89 >59 mL/min/1.73   BUN/Creatinine Ratio 20 12 - 28   Sodium 142 134 - 144 mmol/L   Potassium 4.4 3.5 - 5.2 mmol/L   Chloride 104 96 - 106 mmol/L   CO2 23 20 - 29 mmol/L   Calcium 9.3 8.7 - 10.3 mg/dL   Total Protein 6.6 6.0 - 8.5 g/dL   Albumin 4.4 3.7 - 4.7 g/dL   Globulin, Total 2.2 1.5 - 4.5 g/dL   Albumin/Globulin Ratio 2.0 1.2 - 2.2   Bilirubin Total 0.2 0.0 - 1.2 mg/dL    Alkaline Phosphatase 86 44 - 121 IU/L   AST 19 0 - 40 IU/L   ALT 12 0 - 32 IU/L  Thyroid Panel With TSH  Result Value Ref Range   TSH 1.530 0.450 - 4.500 uIU/mL   T4, Total 9.5 4.5 - 12.0 ug/dL   T3 Uptake Ratio 28 24 - 39 %   Free Thyroxine Index 2.7 1.2 - 4.9  Lipid panel  Result Value Ref Range   Cholesterol, Total 242 (H) 100 - 199 mg/dL   Triglycerides 112 0 - 149 mg/dL   HDL 64 >39 mg/dL   VLDL Cholesterol Cal 20 5 - 40 mg/dL   LDL Chol Calc (NIH) 158 (H) 0 - 99 mg/dL   Chol/HDL Ratio 3.8 0.0 - 4.4 ratio     X-Ray: CXR: No acute findings. Preliminary x-ray reading by Monia Pouch, FNP-C, [facility].  EKG: SR, 79, PR 192 ms. QT 366 ms, atrial enlargement, Q waves in V1 and V2, new from prior EKG 2017, no acute ST-T changes, no ectopy. Monia Pouch, FNP-C.   Pertinent labs & imaging results that were available during my care of the patient were reviewed by me and considered in my medical decision making.  Assessment & Plan:  Naviyah was seen today for shortness of breath f/u.  Diagnoses and all orders for this visit:  Exertional shortness of breath Abnormal EKG CXR unremarkable. Labs pending. EKG with changes concerning for old infarct, no acute ST-T changes. Labs pending. Urgent referral to cardiology placed. Pt  aware of symptoms which require emergent evaluation and treatment. No indications of ACS. No signs of fluid overload.  -     DG Chest 2 View; Future -     EKG 12-Lead -     BMP8+EGFR -     CBC with Differential/Platelet -     Pro b natriuretic peptide -     CK total and CKMB (cardiac)not at [facility] -     Troponin T -     D-dimer, quantitative -     Ambulatory referral to Cardiology    Continue all other maintenance medications.  Follow up plan: Cardiology referral. Aware of if symptoms persist or worsen, she is to go to the ED.    Continue healthy lifestyle choices, including diet (rich in fruits, vegetables, and lean proteins, and low in salt and simple  carbohydrates) and exercise (at least 30 minutes of moderate physical activity daily).  Educational handout given for deconditioning  The above assessment and management plan was discussed with the patient. The patient verbalized understanding of and has agreed to the management plan. Patient is aware to call the clinic if they develop any new symptoms or if symptoms persist or worsen. Patient is aware when to return to the clinic for a follow-up visit. Patient educated on when it is appropriate to go to the emergency department.   Monia Pouch, FNP-C [facility] [phone]

## 2021-08-05 NOTE — Patient Instructions (Signed)
Hope you had a fantastic birthday!

## 2021-08-06 LAB — CBC WITH DIFFERENTIAL/PLATELET
Basophils Absolute: 0 10*3/uL (ref 0.0–0.2)
Basos: 1 %
EOS (ABSOLUTE): 0.1 10*3/uL (ref 0.0–0.4)
Eos: 1 %
Hematocrit: 45.2 % (ref 34.0–46.6)
Hemoglobin: 14.4 g/dL (ref 11.1–15.9)
Immature Grans (Abs): 0 10*3/uL (ref 0.0–0.1)
Immature Granulocytes: 0 %
Lymphocytes Absolute: 2.1 10*3/uL (ref 0.7–3.1)
Lymphs: 37 %
MCH: 29.1 pg (ref 26.6–33.0)
MCHC: 31.9 g/dL (ref 31.5–35.7)
MCV: 91 fL (ref 79–97)
Monocytes Absolute: 0.4 10*3/uL (ref 0.1–0.9)
Monocytes: 6 %
Neutrophils Absolute: 3.1 10*3/uL (ref 1.4–7.0)
Neutrophils: 55 %
Platelets: 253 10*3/uL (ref 150–450)
RBC: 4.95 x10E6/uL (ref 3.77–5.28)
RDW: 12.7 % (ref 11.7–15.4)
WBC: 5.6 10*3/uL (ref 3.4–10.8)

## 2021-08-06 LAB — BMP8+EGFR
BUN/Creatinine Ratio: 10 — ABNORMAL LOW (ref 12–28)
BUN: 10 mg/dL (ref 8–27)
CO2: 25 mmol/L (ref 20–29)
Calcium: 9.4 mg/dL (ref 8.7–10.3)
Chloride: 104 mmol/L (ref 96–106)
Creatinine, Ser: 0.96 mg/dL (ref 0.57–1.00)
Glucose: 96 mg/dL (ref 70–99)
Potassium: 4.5 mmol/L (ref 3.5–5.2)
Sodium: 140 mmol/L (ref 134–144)
eGFR: 61 mL/min/{1.73_m2} (ref >59)

## 2021-08-06 LAB — CK TOTAL AND CKMB (NOT AT ARMC)
CK-MB Index: 1.3 ng/mL (ref 0.0–5.3)
Total CK: 36 U/L (ref 32–182)

## 2021-08-06 LAB — TROPONIN T: Troponin T (Highly Sensitive): 12 ng/L (ref 0–14)

## 2021-08-06 LAB — D-DIMER, QUANTITATIVE: D-DIMER: 0.47 mg/L FEU (ref 0.00–0.49)

## 2021-08-06 LAB — PRO B NATRIURETIC PEPTIDE: NT-Pro BNP: 90 pg/mL (ref 0–738)

## 2021-09-14 ENCOUNTER — Encounter: Payer: Self-pay | Admitting: Cardiovascular Disease

## 2021-09-14 ENCOUNTER — Ambulatory Visit: Payer: Medicare HMO | Admitting: Cardiovascular Disease

## 2021-09-14 DIAGNOSIS — E782 Mixed hyperlipidemia: Secondary | ICD-10-CM | POA: Diagnosis not present

## 2021-09-14 DIAGNOSIS — R0602 Shortness of breath: Secondary | ICD-10-CM | POA: Diagnosis not present

## 2021-09-14 NOTE — Progress Notes (Signed)
09/14/2021 Pamela Henderson   1945/10/23  [id number]  Primary Physician Rakes, Connye Burkitt, FNP Primary Cardiologist: Lorretta Harp MD Lupe Carney, [place]  HPI:  Pamela Henderson is a 76 y.o. thin-appearing divorced Caucasian female mother of 1 daughter, grandmother 1 grandchild referred by Darla Lesches FNP for evaluation of an abnormal EKG and shortness of breath.  She did see Dr. Bronson Ing cardiologist, 6 years ago who did a Myoview stress test that was low risk.  Her only risk factor is untreated hyperlipidemia.  She is never smoked.  There is no family history of heart disease.  She is never had a heart attack or stroke.  She denies chest pain but does get occasional shortness of breath.  She is very active and mows 3 yards without symptoms.  She did have a negative Myoview stress test performed in 2017.   Current Meds  Medication Sig   calcium carbonate (OS-CAL) 600 MG TABS Take 600 mg by mouth daily with breakfast.    levothyroxine (SYNTHROID) 75 MCG tablet Take 1 tablet (75 mcg total) by mouth daily.   meclizine (ANTIVERT) 25 MG tablet Take 1 tablet (25 mg total) by mouth 3 (three) times daily as needed for dizziness.     No Known Allergies  Social History   Socioeconomic History   Marital status: Divorced    Spouse name: Not on file   Number of children: 1   Years of education: 10th grade-then got her GED   Highest education level: GED or equivalent  Occupational History   Not on file  Tobacco Use   Smoking status: Never   Smokeless tobacco: Never  Vaping Use   Vaping Use: Never used  Substance and Sexual Activity   Alcohol use: No   Drug use: No   Sexual activity: Not Currently    Birth control/protection: None    Comment: 1st intercourse- 16, partners- 26, divorced  Other Topics Concern   Not on file  Social History Narrative   Not on file   Social Determinants of Health   Financial Resource Strain: Low Risk  (05/12/2021)   Overall Financial Resource Strain  (CARDIA)    Difficulty of Paying Living Expenses: Not hard at all  Food Insecurity: No Food Insecurity (05/12/2021)   Hunger Vital Sign    Worried About Running Out of Food in the Last Year: Never true    [place] in the Last Year: Never true  Transportation Needs: Unknown (05/12/2021)   PRAPARE - Hydrologist (Medical): No    Lack of Transportation (Non-Medical): Not on file  Physical Activity: Sufficiently Active (05/12/2021)   Exercise Vital Sign    Days of Exercise per Week: 5 days    Minutes of Exercise per Session: 30 min  Stress: No Stress Concern Present (05/12/2021)   [place]    Feeling of Stress : Not at all  Social Connections: Moderately Integrated (05/12/2021)   Social Connection and Isolation Panel [NHANES]    Frequency of Communication with Friends and Family: More than three times a week    Frequency of Social Gatherings with Friends and Family: More than three times a week    Attends Religious Services: 1 to 4 times per year    Active Member of [employer] or Organizations: Yes    Attends Archivist Meetings: 1 to 4 times per year    Marital  Status: Divorced  Human resources officer Violence: Not At Risk (05/12/2021)   Humiliation, Afraid, Rape, and Kick questionnaire    Fear of Current or Ex-Partner: No    Emotionally Abused: No    Physically Abused: No    Sexually Abused: No     Review of Systems: General: negative for chills, fever, night sweats or weight changes.  Cardiovascular: negative for chest pain, dyspnea on exertion, edema, orthopnea, palpitations, paroxysmal nocturnal dyspnea or shortness of breath Dermatological: negative for rash Respiratory: negative for cough or wheezing Urologic: negative for hematuria Abdominal: negative for nausea, vomiting, diarrhea, bright red blood per rectum, melena, or hematemesis Neurologic: negative for visual changes,  syncope, or dizziness All other systems reviewed and are otherwise negative except as noted above.    Blood pressure 100/70, pulse 74, height '5\' 9"'$  (1.753 m), weight 125 lb (56.7 kg), SpO2 95 %.  General appearance: alert and no distress Neck: no adenopathy, no carotid bruit, no JVD, supple, symmetrical, trachea midline, and thyroid not enlarged, symmetric, no tenderness/mass/nodules Lungs: clear to auscultation bilaterally Heart: regular rate and rhythm, S1, S2 normal, no murmur, click, rub or gallop Extremities: extremities normal, atraumatic, no cyanosis or edema Pulses: 2+ and symmetric Skin: Skin color, texture, turgor normal. No rashes or lesions Neurologic: Grossly normal  EKG sinus rhythm at 74 with septal Q waves and left axis deviation.  I personally reviewed this EKG.  ASSESSMENT AND PLAN:   HLD (hyperlipidemia) History of hyperlipidemia intolerant to statin therapy with recent lipid profile performed 06/18/2021 revealing total cholesterol 242, LDL 158 and HDL of 64.  I am going to get a coronary calcium score to determine how aggressive to be with lipid lowering therapy.  Shortness of breath Patient occasionally complains of shortness of breath.  She is very active.  I am going to get a 2D echocardiogram and a coronary calcium score to further evaluate.     Lorretta Harp MD FACP,FACC,FAHA, [facility] 09/14/2021 11:21 AM

## 2021-09-14 NOTE — Patient Instructions (Signed)
Medication Instructions:  Your physician recommends that you continue on your current medications as directed. Please refer to the Current Medication list given to you today.  *If you need a refill on your cardiac medications before your next appointment, please call your pharmacy*   Testing/Procedures: Your physician has requested that you have an echocardiogram. Echocardiography is a painless test that uses sound waves to create images of your heart. It provides your doctor with information about the size and shape of your heart and how well your heart's chambers and valves are working. This procedure takes approximately one hour. There are no restrictions for this procedure. This procedure will be done at [facility]. 2nd Floor    Dr. Gwenlyn Found has ordered a CT coronary calcium score.   Test locations:   [employer] ([place]. 2nd Floor)  This is $99 out of pocket.   Coronary CalciumScan A coronary calcium scan is an imaging test used to look for deposits of calcium and other fatty materials (plaques) in the inner lining of the blood vessels of the heart (coronary arteries). These deposits of calcium and plaques can partly clog and narrow the coronary arteries without producing any symptoms or warning signs. This puts a person at risk for a heart attack. This test can detect these deposits before symptoms develop. Tell a health care provider about: Any allergies you have. All medicines you are taking, including vitamins, herbs, eye drops, creams, and over-the-counter medicines. Any problems you or family members have had with anesthetic medicines. Any blood disorders you have. Any surgeries you have had. Any medical conditions you have. Whether you are pregnant or may be pregnant. What are the risks? Generally, this is a safe procedure. However, problems may occur, including: Harm to a pregnant woman and her unborn baby. This test involves the use of  radiation. Radiation exposure can be dangerous to a pregnant woman and her unborn baby. If you are pregnant, you generally should not have this procedure done. Slight increase in the risk of cancer. This is because of the radiation involved in the test. What happens before the procedure? No preparation is needed for this procedure. What happens during the procedure? You will undress and remove any jewelry around your neck or chest. You will put on a hospital gown. Sticky electrodes will be placed on your chest. The electrodes will be connected to an electrocardiogram (ECG) machine to record a tracing of the electrical activity of your heart. A CT scanner will take pictures of your heart. During this time, you will be asked to lie still and hold your breath for 2-3 seconds while a picture of your heart is being taken. The procedure may vary among health care providers and hospitals. What happens after the procedure? You can get dressed. You can return to your normal activities. It is up to you to get the results of your test. Ask your health care provider, or the department that is doing the test, when your results will be ready. Summary A coronary calcium scan is an imaging test used to look for deposits of calcium and other fatty materials (plaques) in the inner lining of the blood vessels of the heart (coronary arteries). Generally, this is a safe procedure. Tell your health care provider if you are pregnant or may be pregnant. No preparation is needed for this procedure. A CT scanner will take pictures of your heart. You can return to your normal activities after the scan is done. This information  is not intended to replace advice given to you by your health care provider. Make sure you discuss any questions you have with your health care provider. Document Released: 07/16/2007 Document Revised: 12/07/2015 Document Reviewed: 12/07/2015 Elsevier Interactive Patient Education  2017 [place]: At [facility], you and your health needs are our priority.  As part of our continuing mission to provide you with exceptional heart care, we have created designated Provider Care Teams.  These Care Teams include your primary Cardiologist (physician) and Advanced Practice Providers (APPs -  Physician Assistants and Nurse Practitioners) who all work together to provide you with the care you need, when you need it.  We recommend signing up for the patient portal called "MyChart".  Sign up information is provided on this After Visit Summary.  MyChart is used to connect with patients for Virtual Visits (Telemedicine).  Patients are able to view lab/test results, encounter notes, upcoming appointments, etc.  Non-urgent messages can be sent to your provider as well.   To learn more about what you can do with MyChart, go to [email or web address].    Your next appointment:   We will see you on an as needed basis.  Provider:   Quay Burow, MD

## 2021-09-14 NOTE — Assessment & Plan Note (Signed)
History of hyperlipidemia intolerant to statin therapy with recent lipid profile performed 06/18/2021 revealing total cholesterol 242, LDL 158 and HDL of 64.  I am going to get a coronary calcium score to determine how aggressive to be with lipid lowering therapy.

## 2021-09-14 NOTE — Assessment & Plan Note (Signed)
Patient occasionally complains of shortness of breath.  She is very active.  I am going to get a 2D echocardiogram and a coronary calcium score to further evaluate.

## 2021-10-11 ENCOUNTER — Ambulatory Visit (HOSPITAL_BASED_OUTPATIENT_CLINIC_OR_DEPARTMENT_OTHER)
Admission: RE | Admit: 2021-10-11 | Discharge: 2021-10-11 | Disposition: A | Discharge status: Home / Self Care | Payer: Medicare HMO | Source: Ambulatory Visit | Attending: Cardiovascular Disease | Admitting: Cardiovascular Disease

## 2021-10-11 ENCOUNTER — Ambulatory Visit (INDEPENDENT_AMBULATORY_CARE_PROVIDER_SITE_OTHER): Payer: Medicare HMO

## 2021-10-11 DIAGNOSIS — R0602 Shortness of breath: Secondary | ICD-10-CM

## 2021-10-11 DIAGNOSIS — E782 Mixed hyperlipidemia: Secondary | ICD-10-CM

## 2021-10-11 LAB — ECHOCARDIOGRAM COMPLETE
Area-P 1/2: 3.21 cm2
P 1/2 time: 673 msec
S' Lateral: 3.07 cm

## 2021-10-12 ENCOUNTER — Other Ambulatory Visit: Payer: Self-pay | Admitting: Family Medicine

## 2021-10-12 DIAGNOSIS — R918 Other nonspecific abnormal finding of lung field: Secondary | ICD-10-CM

## 2021-12-09 ENCOUNTER — Ambulatory Visit: Payer: Medicare HMO | Admitting: Obstetrics & Gynecology

## 2021-12-21 ENCOUNTER — Encounter: Payer: Self-pay | Admitting: Family Medicine

## 2021-12-21 ENCOUNTER — Ambulatory Visit: Payer: Medicare HMO | Admitting: Family Medicine

## 2022-02-08 ENCOUNTER — Ambulatory Visit (HOSPITAL_COMMUNITY): Admission: RE | Admit: 2022-02-08 | Payer: Medicare HMO | Source: Ambulatory Visit

## 2022-02-17 ENCOUNTER — Encounter: Payer: Self-pay | Admitting: Family Medicine

## 2022-02-17 ENCOUNTER — Ambulatory Visit (INDEPENDENT_AMBULATORY_CARE_PROVIDER_SITE_OTHER): Payer: Medicare HMO | Admitting: Family Medicine

## 2022-02-17 VITALS — BP 113/78 | HR 91 | Temp 98.0°F | Ht 69.0 in | Wt 118.8 lb

## 2022-02-17 DIAGNOSIS — K5901 Slow transit constipation: Secondary | ICD-10-CM

## 2022-02-17 DIAGNOSIS — W19XXXA Unspecified fall, initial encounter: Secondary | ICD-10-CM

## 2022-02-17 DIAGNOSIS — M8589 Other specified disorders of bone density and structure, multiple sites: Secondary | ICD-10-CM | POA: Diagnosis not present

## 2022-02-17 DIAGNOSIS — E782 Mixed hyperlipidemia: Secondary | ICD-10-CM

## 2022-02-17 DIAGNOSIS — L609 Nail disorder, unspecified: Secondary | ICD-10-CM | POA: Diagnosis not present

## 2022-02-17 DIAGNOSIS — E039 Hypothyroidism, unspecified: Secondary | ICD-10-CM

## 2022-02-17 MED ORDER — LEVOTHYROXINE SODIUM 75 MCG PO TABS: 75.0000 ug | ORAL_TABLET | Freq: Every day | ORAL | 1 refills | Status: DC

## 2022-02-17 MED ORDER — POLYETHYLENE GLYCOL 3350 17 GM/SCOOP PO POWD: 17.0000 g | Freq: Every day | ORAL | 1 refills | Status: AC

## 2022-02-17 NOTE — Progress Notes (Signed)
Subjective:  Patient ID: NAMI STRAWDER, female    DOB: 1945-10-10, 77 y.o.   MRN: [id number]  Patient Care Team: Baruch Gouty, FNP as PCP - General (Family Medicine)   Chief Complaint:  Hypothyroidism (3 month follow up ) and Fall (Patient states that she had a fall 2-3 days ago and has bruising on inner thighs )   HPI: Pamela Henderson is a 77 y.o. female presenting on 02/17/2022 for Hypothyroidism (3 month follow up ) and Fall (Patient states that she had a fall 2-3 days ago and has bruising on inner thighs )   Pt presents today for management of chronic medical conditions. She reports a fall 3 days ago. States she lost her balance while trying to move a heater and fell back onto her buttocks.   Fall The fall occurred while standing. She fell from a height of 3 to 5 ft. She landed on Hard floor. The point of impact was the buttocks. The patient is experiencing no pain. Pertinent negatives include no abdominal pain, bowel incontinence, fever, headaches, hearing loss, hematuria, loss of consciousness, nausea, numbness, tingling, visual change or vomiting. She has tried nothing for the symptoms.   1. Acquired hypothyroidism Has been taking medications as prescribed. Does have some constipation at times which is relieved by colace. Has not tried Miralax. No other reported hypo- or hyperthyroid symptoms.   2. Mixed hyperlipidemia Diet controlled. Does try to follow a healthy diet. Active daily. Will check labs today and discuss further management if warranted.   3. Osteopenia of multiple sites Taking Oscal and tolerating well. No arthralgias, trouble walking, or recent fractures. Has visit with GYN scheduled. If DEXA is not completed, will obtain at next office visit.      Relevant past medical, surgical, family, and social history reviewed and updated as indicated.  Allergies and medications reviewed and updated. Data reviewed: Chart in Epic.   Past Medical History:  Diagnosis Date    Hormone disorder    hypothyroidism   Hyperlipidemia    Insomnia    Osteopenia 08/2016   T score -1.9 FRAX 22%/12%   Thyroid disease    hypothyroid    Past Surgical History:  Procedure Laterality Date   BREAST CYST ASPIRATION Left 2002   NO PAST SURGERIES      Social History   Socioeconomic History   Marital status: Divorced    Spouse name: Not on file   Number of children: 1   Years of education: 10th grade-then got her GED   Highest education level: GED or equivalent  Occupational History   Not on file  Tobacco Use   Smoking status: Never   Smokeless tobacco: Never  Vaping Use   Vaping Use: Never used  Substance and Sexual Activity   Alcohol use: No   Drug use: No   Sexual activity: Not Currently    Birth control/protection: None    Comment: 1st intercourse- 16, partners- 41, divorced  Other Topics Concern   Not on file  Social History Narrative   Not on file   Social Determinants of Health   Financial Resource Strain: Low Risk  (05/12/2021)   Overall Financial Resource Strain (CARDIA)    Difficulty of Paying Living Expenses: Not hard at all  Food Insecurity: No Food Insecurity (05/12/2021)   Hunger Vital Sign    Worried About Running Out of Food in the Last Year: Never true    [employer] in the  Last Year: Never true  Transportation Needs: Unknown (05/12/2021)   PRAPARE - Hydrologist (Medical): No    Lack of Transportation (Non-Medical): Not on file  Physical Activity: Sufficiently Active (05/12/2021)   Exercise Vital Sign    Days of Exercise per Week: 5 days    Minutes of Exercise per Session: 30 min  Stress: No Stress Concern Present (05/12/2021)   [place]    Feeling of Stress : Not at all  Social Connections: Moderately Integrated (05/12/2021)   Social Connection and Isolation Panel [NHANES]    Frequency of Communication with Friends and Family: More than  three times a week    Frequency of Social Gatherings with Friends and Family: More than three times a week    Attends Religious Services: 1 to 4 times per year    Active Member of [employer] or Organizations: Yes    Attends Archivist Meetings: 1 to 4 times per year    Marital Status: Divorced  Human resources officer Violence: Not At Risk (05/12/2021)   Humiliation, Afraid, Rape, and Kick questionnaire    Fear of Current or Ex-Partner: No    Emotionally Abused: No    Physically Abused: No    Sexually Abused: No    Outpatient Encounter Medications as of 02/17/2022  Medication Sig   calcium carbonate (OS-CAL) 600 MG TABS Take 600 mg by mouth daily with breakfast.    polyethylene glycol powder (GLYCOLAX/MIRALAX) 17 GM/SCOOP powder Take 17 g by mouth daily.   [DISCONTINUED] levothyroxine (SYNTHROID) 75 MCG tablet Take 1 tablet (75 mcg total) by mouth daily.   [DISCONTINUED] meclizine (ANTIVERT) 25 MG tablet Take 1 tablet (25 mg total) by mouth 3 (three) times daily as needed for dizziness.   levothyroxine (SYNTHROID) 75 MCG tablet Take 1 tablet (75 mcg total) by mouth daily.   No facility-administered encounter medications on file as of 02/17/2022.    No Known Allergies  Review of Systems  Constitutional:  Negative for activity change, appetite change, chills, diaphoresis, fatigue, fever and unexpected weight change.  HENT: Negative.    Eyes: Negative.  Negative for photophobia and visual disturbance.  Respiratory:  Negative for cough, chest tightness and shortness of breath.   Cardiovascular:  Negative for chest pain, palpitations and leg swelling.  Gastrointestinal:  Positive for constipation. Negative for abdominal distention, abdominal pain, anal bleeding, blood in stool, bowel incontinence, diarrhea, nausea, rectal pain and vomiting.  Endocrine: Negative.  Negative for cold intolerance, heat intolerance, polydipsia, polyphagia and polyuria.  Genitourinary:  Negative for difficulty  urinating, dysuria, frequency, hematuria and urgency.  Musculoskeletal:  Negative for arthralgias and myalgias.  Skin: Negative.   Allergic/Immunologic: Negative.   Neurological:  Negative for dizziness, tingling, tremors, seizures, loss of consciousness, syncope, facial asymmetry, speech difficulty, weakness, light-headedness, numbness and headaches.  Hematological: Negative.   Psychiatric/Behavioral:  Negative for confusion, hallucinations, sleep disturbance and suicidal ideas.   All other systems reviewed and are negative.       Objective:  BP 113/78   Pulse 91   Temp 98 F (36.7 C) (Temporal)   Ht '5\' 9"'$  (1.753 m)   Wt 118 lb 12.8 oz (53.9 kg)   SpO2 92%   BMI 17.54 kg/m    Wt Readings from Last 3 Encounters:  02/17/22 118 lb 12.8 oz (53.9 kg)  09/14/21 125 lb (56.7 kg)  08/05/21 125 lb (56.7 kg)    Physical Exam Vitals and  nursing note reviewed.  Constitutional:      General: She is not in acute distress.    Appearance: Normal appearance. She is well-developed and well-groomed. She is not ill-appearing, toxic-appearing or diaphoretic.  HENT:     Head: Normocephalic and atraumatic.     Jaw: There is normal jaw occlusion.     Right Ear: Hearing normal.     Left Ear: Hearing normal.     Nose: Nose normal.     Mouth/Throat:     Lips: Pink.     Mouth: Mucous membranes are moist.     Pharynx: Oropharynx is clear. Uvula midline.  Eyes:     General: Lids are normal.     Extraocular Movements: Extraocular movements intact.     Conjunctiva/sclera: Conjunctivae normal.     Pupils: Pupils are equal, round, and reactive to light.  Neck:     Thyroid: No thyroid mass, thyromegaly or thyroid tenderness.     Vascular: No carotid bruit or JVD.     Trachea: Trachea and phonation normal.  Cardiovascular:     Rate and Rhythm: Normal rate and regular rhythm.     Chest Wall: PMI is not displaced.     Pulses: Normal pulses.     Heart sounds: Normal heart sounds. No murmur  heard.    No friction rub. No gallop.  Pulmonary:     Effort: Pulmonary effort is normal. No respiratory distress.     Breath sounds: Normal breath sounds. No wheezing.  Abdominal:     General: Bowel sounds are normal. There is no distension or abdominal bruit.     Palpations: Abdomen is soft. There is no hepatomegaly, splenomegaly or mass.     Tenderness: There is no abdominal tenderness. There is no right CVA tenderness, left CVA tenderness, guarding or rebound.     Hernia: No hernia is present.  Musculoskeletal:        General: Normal range of motion.     Cervical back: Normal range of motion and neck supple.     Lumbar back: Normal.     Right hip: Normal.     Left hip: Normal.     Right upper leg: Normal.     Left upper leg: Normal.     Right lower leg: No edema.     Left lower leg: No edema.  Lymphadenopathy:     Cervical: No cervical adenopathy.  Skin:    General: Skin is warm and dry.     Capillary Refill: Capillary refill takes less than 2 seconds.     Coloration: Skin is not cyanotic, jaundiced or pale.     Findings: No rash.     Comments: Left index finger: dark pigmented, longitudinal band present. Bruising to inner thighs from recent fall.  Neurological:     General: No focal deficit present.     Mental Status: She is alert and oriented to person, place, and time.     Sensory: Sensation is intact.     Motor: Motor function is intact.     Coordination: Coordination is intact.     Gait: Gait is intact.     Deep Tendon Reflexes: Reflexes are normal and symmetric.  Psychiatric:        Attention and Perception: Attention and perception normal.        Mood and Affect: Mood and affect normal.        Speech: Speech normal.        Behavior: Behavior normal. Behavior is  cooperative.        Thought Content: Thought content normal.        Cognition and Memory: Cognition and memory normal.        Judgment: Judgment normal.     Results for orders placed or performed in  visit on 10/11/21  ECHOCARDIOGRAM COMPLETE  Result Value Ref Range   S' Lateral 3.07 cm   Area-P 1/2 3.21 cm2   P 1/2 time 673 msec       Pertinent labs & imaging results that were available during my care of the patient were reviewed by me and considered in my medical decision making.  Assessment & Plan:  Wayne was seen today for hypothyroidism and fall.  Diagnoses and all orders for this visit:  Mixed hyperlipidemia Diet controlled. Labs pending, will discuss further treatment if warranted.  -     CBC with Differential/Platelet -     CMP14+EGFR -     Lipid panel  Acquired hypothyroidism Thyroid disease has been well controlled. Labs are pending. Adjustments to regimen will be made if warranted. Make sure to take medications on an empty stomach with a full glass of water. Make sure to avoid vitamins or supplements for at least 4 hours before and 4 hours after taking medications. Repeat labs in 3 months if adjustments are made and in 6 months if stable.   -     levothyroxine (SYNTHROID) 75 MCG tablet; Take 1 tablet (75 mcg total) by mouth daily. -     Thyroid Panel With TSH  Osteopenia of multiple sites If DEXA is not completed at GYN, will obtain at next office visit. Continue Oscal.  -     CBC with Differential/Platelet -     CMP14+EGFR -     VITAMIN D 25 Hydroxy (Vit-D Deficiency, Fractures)  Nail abnormalities Concerning for melanoma, referral to dermatology placed.  -     Ambulatory referral to Dermatology  Slow transit constipation Increase water and fiber intake, will try Miralax.  -     polyethylene glycol powder (GLYCOLAX/MIRALAX) 17 GM/SCOOP powder; Take 17 g by mouth daily.  Fall, initial encounter No obvious injuries other than bruising on physical exam. Did not hit head. Pt aware to report new symptoms.     Continue all other maintenance medications.  Follow up plan: Return in about 6 months (around 08/18/2022), or if symptoms worsen or fail to improve, for  annual physical, no PAP, needs DEXA.   Continue healthy lifestyle choices, including diet (rich in fruits, vegetables, and lean proteins, and low in salt and simple carbohydrates) and exercise (at least 30 minutes of moderate physical activity daily).  Educational handout given for hypothyroidism  The above assessment and management plan was discussed with the patient. The patient verbalized understanding of and has agreed to the management plan. Patient is aware to call the clinic if they develop any new symptoms or if symptoms persist or worsen. Patient is aware when to return to the clinic for a follow-up visit. Patient educated on when it is appropriate to go to the emergency department.   Monia Pouch, FNP-C [facility] [phone]

## 2022-02-18 ENCOUNTER — Telehealth: Payer: Self-pay | Admitting: Family Medicine

## 2022-02-18 LAB — LIPID PANEL
Chol/HDL Ratio: 4.4 ratio (ref 0.0–4.4)
Cholesterol, Total: 287 mg/dL — ABNORMAL HIGH (ref 100–199)
HDL: 65 mg/dL (ref >39)
LDL Chol Calc (NIH): 182 mg/dL — ABNORMAL HIGH (ref 0–99)
Triglycerides: 214 mg/dL — ABNORMAL HIGH (ref 0–149)
VLDL Cholesterol Cal: 40 mg/dL (ref 5–40)

## 2022-02-18 LAB — CBC WITH DIFFERENTIAL/PLATELET
Basophils Absolute: 0 10*3/uL (ref 0.0–0.2)
Basos: 1 %
EOS (ABSOLUTE): 0 10*3/uL (ref 0.0–0.4)
Eos: 1 %
Hematocrit: 41.8 % (ref 34.0–46.6)
Hemoglobin: 13.7 g/dL (ref 11.1–15.9)
Immature Grans (Abs): 0 10*3/uL (ref 0.0–0.1)
Immature Granulocytes: 0 %
Lymphocytes Absolute: 2 10*3/uL (ref 0.7–3.1)
Lymphs: 28 %
MCH: 29.4 pg (ref 26.6–33.0)
MCHC: 32.8 g/dL (ref 31.5–35.7)
MCV: 90 fL (ref 79–97)
Monocytes Absolute: 0.4 10*3/uL (ref 0.1–0.9)
Monocytes: 6 %
Neutrophils Absolute: 4.6 10*3/uL (ref 1.4–7.0)
Neutrophils: 64 %
Platelets: 268 10*3/uL (ref 150–450)
RBC: 4.66 x10E6/uL (ref 3.77–5.28)
RDW: 13.5 % (ref 11.7–15.4)
WBC: 7.1 10*3/uL (ref 3.4–10.8)

## 2022-02-18 LAB — CMP14+EGFR
ALT: 12 IU/L (ref 0–32)
AST: 16 IU/L (ref 0–40)
Albumin/Globulin Ratio: 1.8 (ref 1.2–2.2)
Albumin: 4.2 g/dL (ref 3.8–4.8)
Alkaline Phosphatase: 98 IU/L (ref 44–121)
BUN/Creatinine Ratio: 23 (ref 12–28)
BUN: 17 mg/dL (ref 8–27)
Bilirubin Total: 0.5 mg/dL (ref 0.0–1.2)
CO2: 20 mmol/L (ref 20–29)
Calcium: 9.3 mg/dL (ref 8.7–10.3)
Chloride: 101 mmol/L (ref 96–106)
Creatinine, Ser: 0.73 mg/dL (ref 0.57–1.00)
Globulin, Total: 2.4 g/dL (ref 1.5–4.5)
Glucose: 92 mg/dL (ref 70–99)
Potassium: 3.9 mmol/L (ref 3.5–5.2)
Sodium: 140 mmol/L (ref 134–144)
Total Protein: 6.6 g/dL (ref 6.0–8.5)
eGFR: 84 mL/min/{1.73_m2} (ref >59)

## 2022-02-18 LAB — THYROID PANEL WITH TSH
Free Thyroxine Index: 3.5 (ref 1.2–4.9)
T3 Uptake Ratio: 31 % (ref 24–39)
T4, Total: 11.3 ug/dL (ref 4.5–12.0)
TSH: 1.74 u[IU]/mL (ref 0.450–4.500)

## 2022-02-18 LAB — VITAMIN D 25 HYDROXY (VIT D DEFICIENCY, FRACTURES): Vit D, 25-Hydroxy: 23.4 ng/mL — ABNORMAL LOW (ref 30.0–100.0)

## 2022-03-07 ENCOUNTER — Ambulatory Visit (HOSPITAL_COMMUNITY)
Admission: RE | Admit: 2022-03-07 | Discharge: 2022-03-07 | Disposition: A | Discharge status: Home / Self Care | Payer: Medicare HMO | Source: Ambulatory Visit | Attending: Family Medicine | Admitting: Family Medicine

## 2022-03-07 DIAGNOSIS — R918 Other nonspecific abnormal finding of lung field: Secondary | ICD-10-CM

## 2022-03-22 ENCOUNTER — Ambulatory Visit (HOSPITAL_COMMUNITY): Payer: Medicare HMO

## 2022-03-28 DIAGNOSIS — D125 Benign neoplasm of sigmoid colon: Secondary | ICD-10-CM | POA: Diagnosis not present

## 2022-03-28 DIAGNOSIS — D123 Benign neoplasm of transverse colon: Secondary | ICD-10-CM | POA: Diagnosis not present

## 2022-03-28 DIAGNOSIS — K635 Polyp of colon: Secondary | ICD-10-CM | POA: Diagnosis not present

## 2022-03-28 DIAGNOSIS — Z09 Encounter for follow-up examination after completed treatment for conditions other than malignant neoplasm: Secondary | ICD-10-CM | POA: Diagnosis not present

## 2022-03-28 DIAGNOSIS — Z86010 Personal history of colonic polyps: Secondary | ICD-10-CM | POA: Diagnosis not present

## 2022-03-28 LAB — HM COLONOSCOPY

## 2022-05-10 DIAGNOSIS — L570 Actinic keratosis: Secondary | ICD-10-CM | POA: Diagnosis not present

## 2022-05-18 ENCOUNTER — Ambulatory Visit (INDEPENDENT_AMBULATORY_CARE_PROVIDER_SITE_OTHER): Payer: Medicare HMO

## 2022-05-18 VITALS — Ht 69.0 in | Wt 118.0 lb

## 2022-05-18 DIAGNOSIS — Z0000 Encounter for general adult medical examination without abnormal findings: Secondary | ICD-10-CM

## 2022-05-18 NOTE — Progress Notes (Signed)
Subjective:   Pamela Henderson is a 77 y.o. female who presents for Medicare Annual (Subsequent) preventive examination. I connected with  Pamela Henderson on 05/18/22 by a audio enabled telemedicine application and verified that I am speaking with the correct person using two identifiers.  Patient Location: Home  Provider Location: Home Office  I discussed the limitations of evaluation and management by telemedicine. The patient expressed understanding and agreed to proceed.  Review of Systems     Cardiac Risk Factors include: advanced age (>1men, >77 women)     Objective:    Today's Vitals   05/18/22 1455  Weight: 118 lb (53.5 kg)  Height:  (1.753 m)   Body mass index is 17.43 kg/m.     05/18/2022    2:58 PM 05/12/2021    2:37 PM 04/07/2020    9:56 AM 10/16/2018    8:39 AM 09/25/2017    8:17 AM  Advanced Directives  Does Patient Have a Medical Advance Directive? Yes Yes No No No  Type of Estate agent of [place];Living will Healthcare Power of [place];Living will   Healthcare Power of Attorney  Copy of Healthcare Power of Attorney in Chart? No - copy requested No - copy requested   No - copy requested  Would patient like information on creating a medical advance directive?   No - Patient declined No - Patient declined Yes (MAU/Ambulatory/Procedural Areas - Information given)    Current Medications (verified) Outpatient Encounter Medications as of 05/18/2022  Medication Sig   calcium carbonate (OS-CAL) 600 MG TABS Take 600 mg by mouth daily with breakfast.    levothyroxine (SYNTHROID) 75 MCG tablet Take 1 tablet (75 mcg total) by mouth daily.   Omega-3 1000 MG CAPS Take by mouth.   polyethylene glycol powder (GLYCOLAX/MIRALAX) 17 GM/SCOOP powder Take 17 g by mouth daily.   No facility-administered encounter medications on file as of 05/18/2022.    Allergies (verified) Patient has no known allergies.   History: Past Medical History:  Diagnosis  Date   Hormone disorder    hypothyroidism   Hyperlipidemia    Insomnia    Osteopenia 08/2016   T score -1.9 FRAX 22%/12%   Thyroid disease    hypothyroid   Past Surgical History:  Procedure Laterality Date   BREAST CYST ASPIRATION Left 2002   NO PAST SURGERIES     Family History  Problem Relation Age of Onset   Cancer Father 71       lung- smoker   Hypertension Brother    Cancer Brother        lung   Stroke Mother    Breast cancer Maternal Grandmother    Social History   Socioeconomic History   Marital status: Divorced    Spouse name: Not on file   Number of children: 1   Years of education: 10th grade-then got her GED   Highest education level: GED or equivalent  Occupational History   Not on file  Tobacco Use   Smoking status: Never   Smokeless tobacco: Never  Vaping Use   Vaping Use: Never used  Substance and Sexual Activity   Alcohol use: No   Drug use: No   Sexual activity: Not Currently    Birth control/protection: None    Comment: 1st intercourse- 16, partners- 4, divorced  Other Topics Concern   Not on file  Social History Narrative   Not on file   Social Determinants of Health   Financial  Resource Strain: Low Risk  (05/18/2022)   Overall Financial Resource Strain (CARDIA)    Difficulty of Paying Living Expenses: Not hard at all  Food Insecurity: No Food Insecurity (05/18/2022)   Hunger Vital Sign    Worried About Running Out of Food in the Last Year: Never true    Ran Out of Food in the Last Year: Never true  Transportation Needs: No Transportation Needs (05/18/2022)   PRAPARE - Administrator, Civil Service (Medical): No    Lack of Transportation (Non-Medical): No  Physical Activity: Insufficiently Active (05/18/2022)   Exercise Vital Sign    Days of Exercise per Week: 3 days    Minutes of Exercise per Session: 30 min  Stress: No Stress Concern Present (05/18/2022)   [employer] of Occupational Health - Occupational Stress  Questionnaire    Feeling of Stress : Not at all  Social Connections: Moderately Isolated (05/18/2022)   Social Connection and Isolation Panel [NHANES]    Frequency of Communication with Friends and Family: More than three times a week    Frequency of Social Gatherings with Friends and Family: More than three times a week    Attends Religious Services: More than 4 times per year    Active Member of [employer] or Organizations: No    Attends Engineer, structural: Never    Marital Status: Divorced    Tobacco Counseling Counseling given: Not Answered   Clinical Intake:  Pre-visit preparation completed: Yes  Pain : No/denies pain     Nutritional Risks: None Diabetes: No  How often do you need to have someone help you when you read instructions, pamphlets, or other written materials from your doctor or pharmacy?: 1 - Never  Diabetic?no   Interpreter Needed?: No  Information entered by :: Renie Ora, LPN   Activities of Daily Living    05/18/2022    2:58 PM  In your present state of health, do you have any difficulty performing the following activities:  Hearing? 0  Vision? 0  Difficulty concentrating or making decisions? 0  Walking or climbing stairs? 0  Dressing or bathing? 0  Doing errands, shopping? 0  Preparing Food and eating ? N  Using the Toilet? N  In the past six months, have you accidently leaked urine? N  Do you have problems with loss of bowel control? N  Managing your Medications? N  Managing your Finances? N  Housekeeping or managing your Housekeeping? N    Patient Care Team: Sonny Masters, FNP as PCP - General (Family Medicine)  Indicate any recent Medical Services you may have received from other than [facility] providers in the past year (date may be approximate).     Assessment:   This is a routine wellness examination for Pamela Henderson.  Hearing/Vision screen Vision Screening - Comments:: Wears rx glasses - up to date with routine eye exams with   Dr.Groat   Dietary issues and exercise activities discussed: Current Exercise Habits: Home exercise routine, Type of exercise: walking, Time (Minutes): 30, Frequency (Times/Week): 3, Weekly Exercise (Minutes/Week): 90, Intensity: Mild, Exercise limited by: None identified   Goals Addressed             This Visit's Progress    DIET - EAT MORE FRUITS AND VEGETABLES   On track    DIET - INCREASE WATER INTAKE   On track    Try to drink 6-8 glasses of water daily.       Depression Screen  05/18/2022    2:57 PM 02/17/2022   12:29 PM 08/05/2021   11:15 AM 05/12/2021    2:34 PM 10/01/2020   10:57 AM 04/15/2020    2:00 PM 04/07/2020    9:56 AM  PHQ 2/9 Scores  PHQ - 2 Score 0 0 0 0 0 0 0  PHQ- 9 Score  3 1   0     Fall Risk    05/18/2022    2:56 PM 02/17/2022   12:28 PM 05/12/2021    2:37 PM 10/01/2020   10:57 AM 04/15/2020    2:00 PM  Fall Risk   Falls in the past year? 0 1 0 0 0  Number falls in past yr: 0 0 0    Injury with Fall? 0 1 0    Risk for fall due to : No Fall Risks  No Fall Risks    Follow up Falls prevention discussed Education provided Falls prevention discussed      FALL RISK PREVENTION PERTAINING TO THE HOME:  Any stairs in or around the home? Yes  If so, are there any without handrails? No  Home free of loose throw rugs in walkways, pet beds, electrical cords, etc? Yes  Adequate lighting in your home to reduce risk of falls? Yes   ASSISTIVE DEVICES UTILIZED TO PREVENT FALLS:  Life alert? No  Use of a cane, walker or w/c? No  Grab bars in the bathroom? No  Shower chair or bench in shower? No  Elevated toilet seat or a handicapped toilet? No       09/25/2017    8:21 AM  MMSE - Mini Mental State Exam  Orientation to time 5  Orientation to Place 5  Registration 3  Attention/ Calculation 5  Recall 3  Language- name 2 objects 2  Language- repeat 1  Language- follow 3 step command 3  Language- read & follow direction 1  Write a sentence 1  Copy design  1  Total score 30        05/18/2022    2:59 PM 04/07/2020   10:00 AM 10/16/2018    8:44 AM 09/25/2017    8:23 AM  6CIT Screen  What Year? 0 points 0 points 0 points 0 points  What month? 0 points 0 points 0 points 0 points  What time? 0 points 0 points 0 points 0 points  Count back from 20 0 points 0 points 0 points 0 points  Months in reverse 0 points 4 points 0 points 0 points  Repeat phrase 0 points 0 points 0 points 0 points  Total Score 0 points 4 points 0 points 0 points    Immunizations Immunization History  Administered Date(s) Administered   Fluad Quad(high Dose 65+) 11/27/2020, 01/17/2022   Influenza, High Dose Seasonal PF 11/17/2016, 11/06/2017, 12/08/2018   Moderna Sars-Covid-2 Vaccination 04/12/2019, 05/15/2019    TDAP status: Due, Education has been provided regarding the importance of this vaccine. Advised may receive this vaccine at local pharmacy or Health Dept. Aware to provide a copy of the vaccination record if obtained from local pharmacy or Health Dept. Verbalized acceptance and understanding.  Flu Vaccine status: Up to date  Pneumococcal vaccine status: Due, Education has been provided regarding the importance of this vaccine. Advised may receive this vaccine at local pharmacy or Health Dept. Aware to provide a copy of the vaccination record if obtained from local pharmacy or Health Dept. Verbalized acceptance and understanding.  Covid-19 vaccine status: Completed vaccines  Qualifies for Shingles Vaccine? Yes   Zostavax completed No   Shingrix Completed?: No.    Education has been provided regarding the importance of this vaccine. Patient has been advised to call insurance company to determine out of pocket expense if they have not yet received this vaccine. Advised may also receive vaccine at local pharmacy or Health Dept. Verbalized acceptance and understanding.  Screening Tests Health Maintenance  Topic Date Due   COVID-19 Vaccine (3 - 2023-24 season)  10/01/2021   Zoster Vaccines- Shingrix (1 of 2) 05/19/2022 (Originally 07/31/1993)   Pneumonia Vaccine 33+ Years old (1 of 1 - PCV) 08/06/2022 (Originally 07/31/2008)   Hepatitis C Screening  08/06/2022 (Originally 07/31/1963)   INFLUENZA VACCINE  09/01/2022   Medicare Annual Wellness (AWV)  05/18/2023   DEXA SCAN  Completed   HPV VACCINES  Aged Out   DTaP/Tdap/Td  Discontinued   COLONOSCOPY (Pts 45-67yrs Insurance coverage will need to be confirmed)  Discontinued    Health Maintenance  Health Maintenance Due  Topic Date Due   COVID-19 Vaccine (3 - 2023-24 season) 10/01/2021    Colorectal cancer screening: Type of screening: Colonoscopy. Completed 03/28/2022. Repeat every 10 years  Mammogram status: No longer required due to age.  Bone Density status: Ordered declined . Pt provided with contact info and advised to call to schedule appt.  Lung Cancer Screening: (Low Dose CT Chest recommended if Age 3-80 years, 30 pack-year currently smoking OR have quit w/in 15years.) does not qualify.   Lung Cancer Screening Referral: n/a  Additional Screening:  Hepatitis C Screening: does not qualify;   Vision Screening: Recommended annual ophthalmology exams for early detection of glaucoma and other disorders of the eye. Is the patient up to date with their annual eye exam?  Yes  Who is the provider or what is the name of the office in which the patient attends annual eye exams? Dr.groat  If pt is not established with a provider, would they like to be referred to a provider to establish care? No .   Dental Screening: Recommended annual dental exams for proper oral hygiene  Community Resource Referral / Chronic Care Management: CRR required this visit?  No   CCM required this visit?  No      Plan:     I have personally reviewed and noted the following in the patient's chart:   Medical and social history Use of alcohol, tobacco or illicit drugs  Current medications and supplements  including opioid prescriptions. Patient is not currently taking opioid prescriptions. Functional ability and status Nutritional status Physical activity Advanced directives List of other physicians Hospitalizations, surgeries, and ER visits in previous 12 months Vitals Screenings to include cognitive, depression, and falls Referrals and appointments  In addition, I have reviewed and discussed with patient certain preventive protocols, quality metrics, and best practice recommendations. A written personalized care plan for preventive services as well as general preventive health recommendations were provided to patient.     Lorrene Reid, LPN   [id number]   Nurse Notes: Due Tdap Vaccine

## 2022-05-18 NOTE — Patient Instructions (Signed)
Pamela Henderson , Thank you for taking time to come for your Medicare Wellness Visit. I appreciate your ongoing commitment to your health goals. Please review the following plan we discussed and let me know if I can assist you in the future.   These are the goals we discussed:  Goals       DIET - EAT MORE FRUITS AND VEGETABLES      DIET - INCREASE WATER INTAKE      Try to drink 6-8 glasses of water daily.      DIET - REDUCE FAT INTAKE      Patient Stated (pt-stated)      " I would like to help the people in my community"        This is a list of the screening recommended for you and due dates:  Health Maintenance  Topic Date Due   COVID-19 Vaccine (3 - 2023-24 season) 10/01/2021   Zoster (Shingles) Vaccine (1 of 2) 05/19/2022*   Pneumonia Vaccine (1 of 1 - PCV) 08/06/2022*   Hepatitis C Screening: USPSTF Recommendation to screen - Ages 18-79 yo.  08/06/2022*   Flu Shot  09/01/2022   Medicare Annual Wellness Visit  05/18/2023   DEXA scan (bone density measurement)  Completed   HPV Vaccine  Aged Out   DTaP/Tdap/Td vaccine  Discontinued   Colon Cancer Screening  Discontinued  *Topic was postponed. The date shown is not the original due date.    Advanced directives: Please bring a copy of your health care power of attorney and living will to the office to be added to your chart at your convenience.   Conditions/risks identified: Aim for 30 minutes of exercise or brisk walking, 6-8 glasses of water, and 5 servings of fruits and vegetables each day.   Next appointment: Follow up in one year for your annual wellness visit    Preventive Care 65 Years and Older, Female Preventive care refers to lifestyle choices and visits with your health care provider that can promote health and wellness. What does preventive care include? A yearly physical exam. This is also called an annual well check. Dental exams once or twice a year. Routine eye exams. Ask your health care provider how often you  should have your eyes checked. Personal lifestyle choices, including: Daily care of your teeth and gums. Regular physical activity. Eating a healthy diet. Avoiding tobacco and drug use. Limiting alcohol use. Practicing safe sex. Taking low-dose aspirin every day. Taking vitamin and mineral supplements as recommended by your health care provider. What happens during an annual well check? The services and screenings done by your health care provider during your annual well check will depend on your age, overall health, lifestyle risk factors, and family history of disease. Counseling  Your health care provider may ask you questions about your: Alcohol use. Tobacco use. Drug use. Emotional well-being. Home and relationship well-being. Sexual activity. Eating habits. History of falls. Memory and ability to understand (cognition). Work and work Astronomer. Reproductive health. Screening  You may have the following tests or measurements: Height, weight, and BMI. Blood pressure. Lipid and cholesterol levels. These may be checked every 5 years, or more frequently if you are over 66 years old. Skin check. Lung cancer screening. You may have this screening every year starting at age 67 if you have a 30-pack-year history of smoking and currently smoke or have quit within the past 15 years. Fecal occult blood test (FOBT) of the stool. You may have this  test every year starting at age 60. Flexible sigmoidoscopy or colonoscopy. You may have a sigmoidoscopy every 5 years or a colonoscopy every 10 years starting at age 7. Hepatitis C blood test. Hepatitis B blood test. Sexually transmitted disease (STD) testing. Diabetes screening. This is done by checking your blood sugar (glucose) after you have not eaten for a while (fasting). You may have this done every 1-3 years. Bone density scan. This is done to screen for osteoporosis. You may have this done starting at age 33. Mammogram. This may  be done every 1-2 years. Talk to your health care provider about how often you should have regular mammograms. Talk with your health care provider about your test results, treatment options, and if necessary, the need for more tests. Vaccines  Your health care provider may recommend certain vaccines, such as: Influenza vaccine. This is recommended every year. Tetanus, diphtheria, and acellular pertussis (Tdap, Td) vaccine. You may need a Td booster every 10 years. Zoster vaccine. You may need this after age 17. Pneumococcal 13-valent conjugate (PCV13) vaccine. One dose is recommended after age 25. Pneumococcal polysaccharide (PPSV23) vaccine. One dose is recommended after age 70. Talk to your health care provider about which screenings and vaccines you need and how often you need them. This information is not intended to replace advice given to you by your health care provider. Make sure you discuss any questions you have with your health care provider. Document Released: 02/13/2015 Document Revised: 10/07/2015 Document Reviewed: 11/18/2014 Elsevier Interactive Patient Education  2017 [employer].  Fall Prevention in the Home Falls can cause injuries. They can happen to people of all ages. There are many things you can do to make your home safe and to help prevent falls. What can I do on the outside of my home? Regularly fix the edges of walkways and driveways and fix any cracks. Remove anything that might make you trip as you walk through a door, such as a raised step or threshold. Trim any bushes or trees on the path to your home. Use bright outdoor lighting. Clear any walking paths of anything that might make someone trip, such as rocks or tools. Regularly check to see if handrails are loose or broken. Make sure that both sides of any steps have handrails. Any raised decks and porches should have guardrails on the edges. Have any leaves, snow, or ice cleared regularly. Use sand or salt  on walking paths during winter. Clean up any spills in your garage right away. This includes oil or grease spills. What can I do in the bathroom? Use night lights. Install grab bars by the toilet and in the tub and shower. Do not use towel bars as grab bars. Use non-skid mats or decals in the tub or shower. If you need to sit down in the shower, use a plastic, non-slip stool. Keep the floor dry. Clean up any water that spills on the floor as soon as it happens. Remove soap buildup in the tub or shower regularly. Attach bath mats securely with double-sided non-slip rug tape. Do not have throw rugs and other things on the floor that can make you trip. What can I do in the bedroom? Use night lights. Make sure that you have a light by your bed that is easy to reach. Do not use any sheets or blankets that are too big for your bed. They should not hang down onto the floor. Have a firm chair that has side arms. You can  use this for support while you get dressed. Do not have throw rugs and other things on the floor that can make you trip. What can I do in the kitchen? Clean up any spills right away. Avoid walking on wet floors. Keep items that you use a lot in easy-to-reach places. If you need to reach something above you, use a strong step stool that has a grab bar. Keep electrical cords out of the way. Do not use floor polish or wax that makes floors slippery. If you must use wax, use non-skid floor wax. Do not have throw rugs and other things on the floor that can make you trip. What can I do with my stairs? Do not leave any items on the stairs. Make sure that there are handrails on both sides of the stairs and use them. Fix handrails that are broken or loose. Make sure that handrails are as long as the stairways. Check any carpeting to make sure that it is firmly attached to the stairs. Fix any carpet that is loose or worn. Avoid having throw rugs at the top or bottom of the stairs. If you  do have throw rugs, attach them to the floor with carpet tape. Make sure that you have a light switch at the top of the stairs and the bottom of the stairs. If you do not have them, ask someone to add them for you. What else can I do to help prevent falls? Wear shoes that: Do not have high heels. Have rubber bottoms. Are comfortable and fit you well. Are closed at the toe. Do not wear sandals. If you use a stepladder: Make sure that it is fully opened. Do not climb a closed stepladder. Make sure that both sides of the stepladder are locked into place. Ask someone to hold it for you, if possible. Clearly mark and make sure that you can see: Any grab bars or handrails. First and last steps. Where the edge of each step is. Use tools that help you move around (mobility aids) if they are needed. These include: Canes. Walkers. Scooters. Crutches. Turn on the lights when you go into a dark area. Replace any light bulbs as soon as they burn out. Set up your furniture so you have a clear path. Avoid moving your furniture around. If any of your floors are uneven, fix them. If there are any pets around you, be aware of where they are. Review your medicines with your doctor. Some medicines can make you feel dizzy. This can increase your chance of falling. Ask your doctor what other things that you can do to help prevent falls. This information is not intended to replace advice given to you by your health care provider. Make sure you discuss any questions you have with your health care provider. Document Released: 11/13/2008 Document Revised: 06/25/2015 Document Reviewed: 02/21/2014 Elsevier Interactive Patient Education  2017 [employer].

## 2022-05-30 ENCOUNTER — Other Ambulatory Visit: Payer: Self-pay | Admitting: Obstetrics & Gynecology

## 2022-05-30 DIAGNOSIS — Z1231 Encounter for screening mammogram for malignant neoplasm of breast: Secondary | ICD-10-CM

## 2022-06-02 ENCOUNTER — Ambulatory Visit
Admission: RE | Admit: 2022-06-02 | Discharge: 2022-06-02 | Disposition: A | Discharge status: Home / Self Care | Payer: Medicare HMO | Source: Ambulatory Visit | Attending: Obstetrics & Gynecology | Admitting: Obstetrics & Gynecology

## 2022-06-02 DIAGNOSIS — Z1231 Encounter for screening mammogram for malignant neoplasm of breast: Secondary | ICD-10-CM

## 2022-07-04 IMAGING — MG MM DIGITAL DIAGNOSTIC UNILAT*R* W/ TOMO W/ CAD
6 series · 6 of 18 positions shown · non-contrast
Comparison: Previous exams including recent screening mammogram
dated 02/18/2021.

CLINICAL DATA: Patient returns today to evaluate a possible RIGHT
breast asymmetry questioned on recent screening mammogram.

EXAM:
DIGITAL DIAGNOSTIC UNILATERAL RIGHT MAMMOGRAM WITH TOMOSYNTHESIS AND
CAD
TECHNIQUE: Right digital diagnostic mammography and breast tomosynthesis was
performed. The images were evaluated with computer-aided detection.

[R ML synth-2D (1 of 2)]
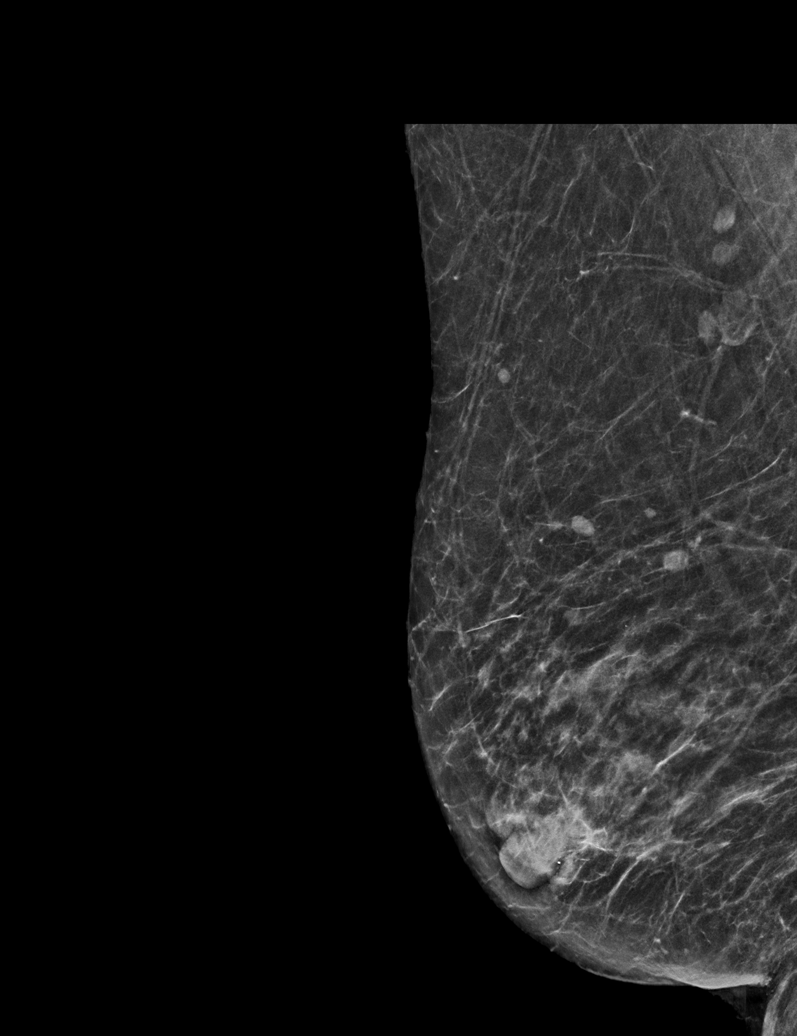

[R ML synth-2D (2 of 2)]
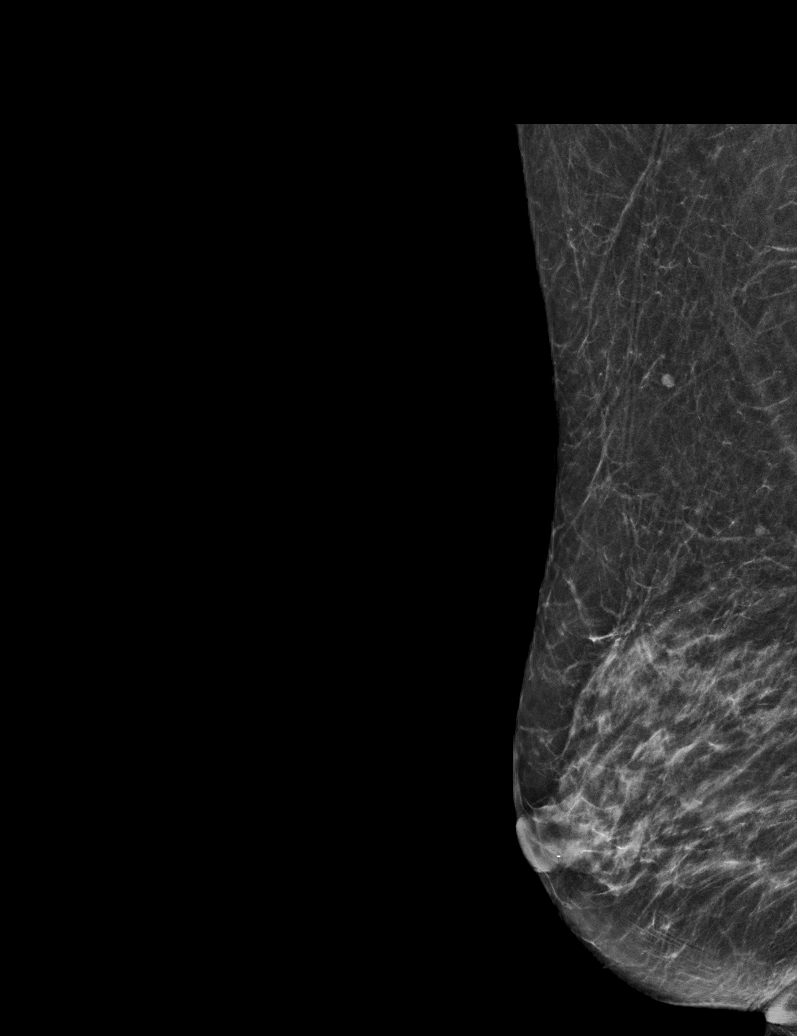

[R CC synth-2D]
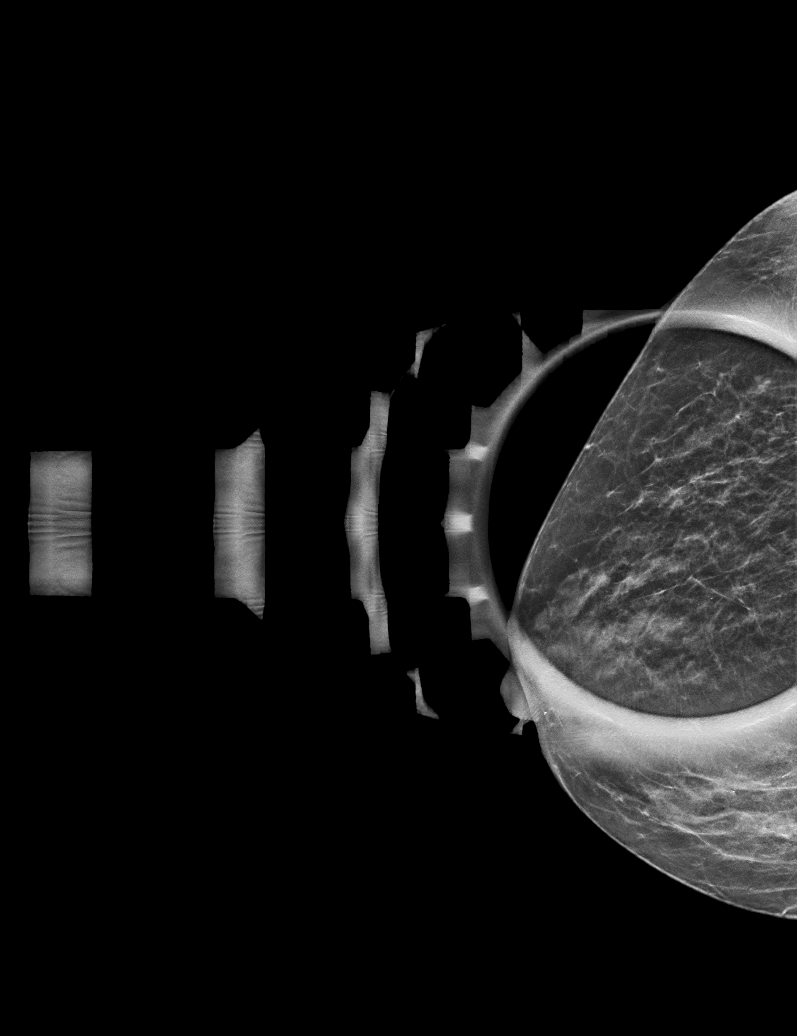

[R ML tomo (1 of 2) · tomo slice 26/51.0]
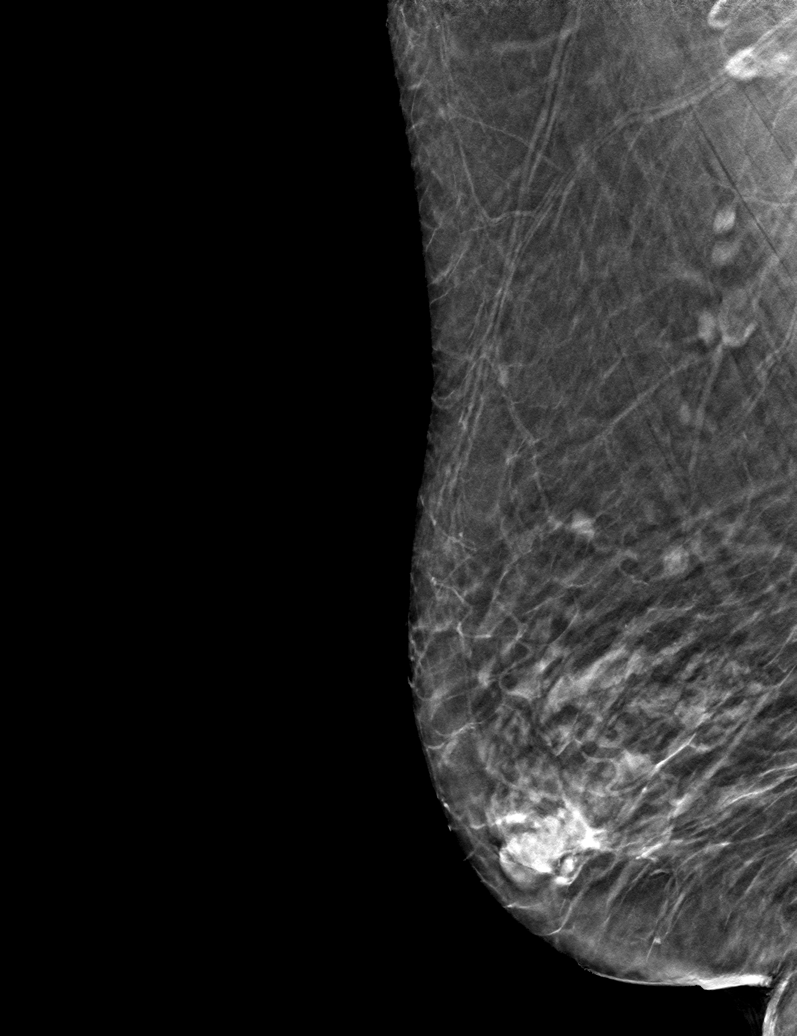

[R CC tomo · tomo slice 18/35.0]
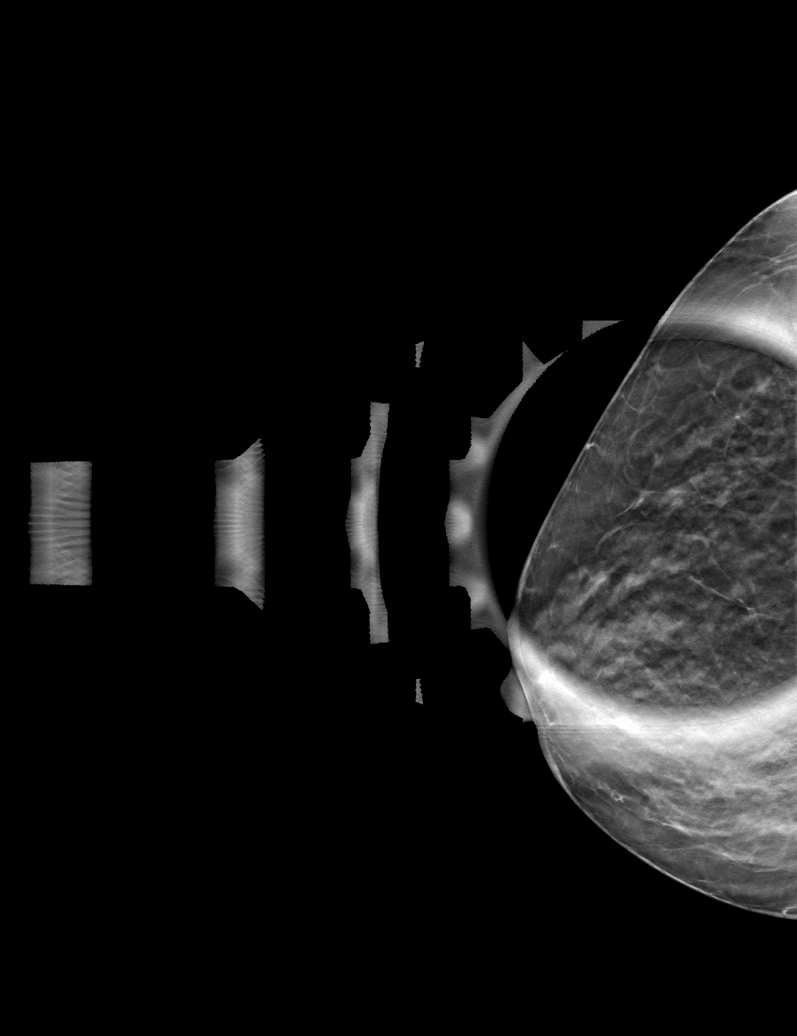

[R ML tomo (2 of 2) · tomo slice 25/49.0]
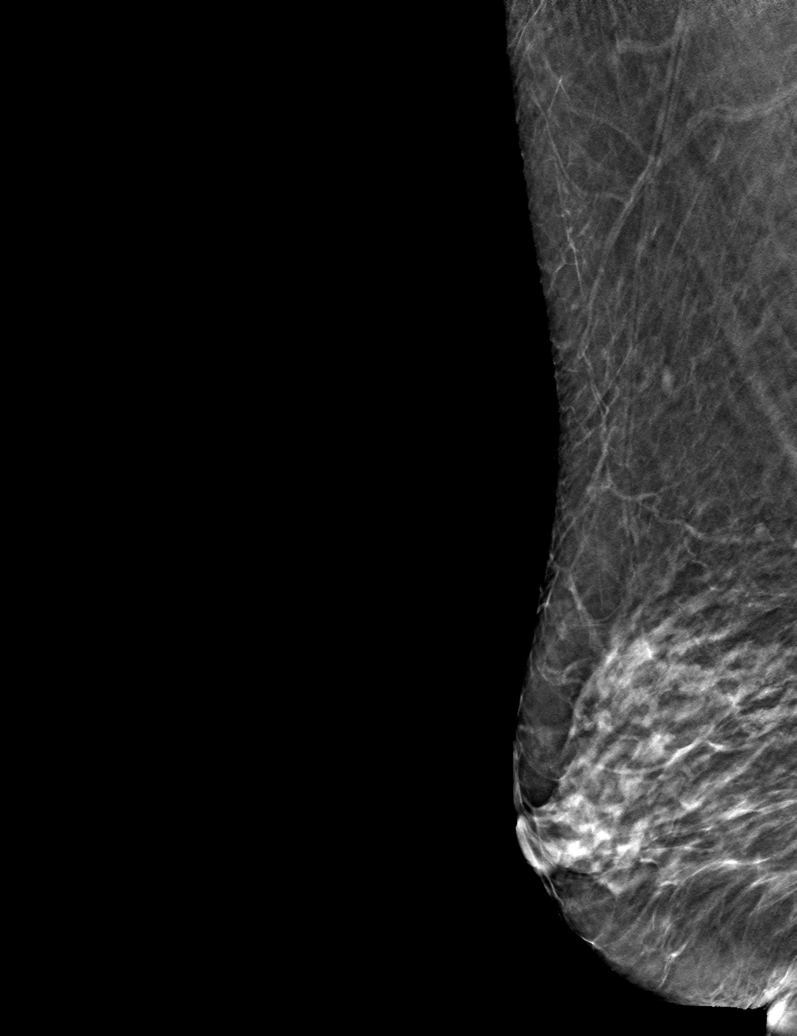

[6 of 18 positions shown; findings below may reference images not displayed]

ACR Breast Density Category b: There are scattered areas of
fibroglandular density.
FINDINGS: On today's additional diagnostic views, including spot compression
with 3D tomosynthesis, there is no persistent asymmetry within the
outer RIGHT breast indicating superimposition of normal
fibroglandular tissues.
IMPRESSION: No evidence of malignancy.

Patient may return to routine annual bilateral screening mammogram
schedule.

RECOMMENDATION:
Screening mammogram in one year.(Code:LA-F-GXZ)

I have discussed the findings and recommendations with the patient.
If applicable, a reminder letter will be sent to the patient
regarding the next appointment.

BI-RADS CATEGORY  1: Negative.

## 2022-07-12 ENCOUNTER — Encounter: Payer: Self-pay | Admitting: Family Medicine

## 2022-07-12 ENCOUNTER — Ambulatory Visit (INDEPENDENT_AMBULATORY_CARE_PROVIDER_SITE_OTHER): Payer: Medicare HMO | Admitting: Family Medicine

## 2022-07-12 VITALS — BP 137/77 | HR 67 | Temp 97.3°F | Ht 69.0 in | Wt 122.6 lb

## 2022-07-12 DIAGNOSIS — M25511 Pain in right shoulder: Secondary | ICD-10-CM | POA: Insufficient documentation

## 2022-07-12 DIAGNOSIS — M549 Dorsalgia, unspecified: Secondary | ICD-10-CM | POA: Diagnosis not present

## 2022-07-12 MED ORDER — NAPROXEN 500 MG PO TABS: 500.0000 mg | ORAL_TABLET | Freq: Two times a day (BID) | ORAL | 0 refills | Status: AC

## 2022-07-12 NOTE — Patient Instructions (Addendum)
You have been prescribed a nonsteroidal anti-inflammatory drug (NSAID) today. This will help with your pain and inflammation. Please do not take any other NSAIDs (ibuprofen/Motrin/Advil, naproxen/Aleve, meloxicam/Mobic, Voltaren/diclofenac). Please make sure to eat a meal when taking this medication.   Caution: If you have a history of acid reflux/indigestion, I recommend that you take an antacid (such as Prilosec, Prevacid) daily while on the NSAID. If you have a history of bleeding disorder, gastric ulcer, are on a blood thinner (like warfarin/Coumadin, Xarelto, Eliquis, etc) please do not take NSAID. If you have ever had a heart attack, you should not take NSAIDs.    Start taking Naproxin 500 mg BID orally with food   Physical therapy will call and set up appointment for treatment of back and shoulder pain

## 2022-07-12 NOTE — Progress Notes (Signed)
Subjective:  Patient ID: Pamela Henderson, female    DOB: Jan 02, 1946, 77 y.o.   MRN: [id number]  Patient Care Team: Sonny Masters, FNP as PCP - General (Family Medicine)   Chief Complaint:  Back Pain (X 1 month after lifting something heavy )   HPI: Pamela Henderson is a 77 y.o. female presenting on 07/12/2022 for Back Pain (X 1 month after lifting something heavy )   Pt C/o 5/10 mid back pain. Sharp, aching pain just below shoulder blades for 1 month after lifting a table off of a truck with neighbor. Movement 8/10 impacitng sleep and ability to do yard work. Takes 200mg  of Ibuprophen at bedtime with some relief. Pt states similar back pain 15 -20 years ago was helped with physical therapy.  Back Pain This is a new problem. The current episode started 1 to 4 weeks ago. The problem occurs intermittently. The problem has been waxing and waning since onset. The pain is present in the thoracic spine. The quality of the pain is described as aching. The pain is mild. The symptoms are aggravated by position, stress and twisting. Pertinent negatives include no abdominal pain, bladder incontinence, bowel incontinence, chest pain, dysuria, fever, headaches, leg pain, numbness, paresis, paresthesias, pelvic pain, perianal numbness, tingling, weakness or weight loss. She has tried nothing for the symptoms.     Relevant past medical, surgical, family, and social history reviewed and updated as indicated.  Allergies and medications reviewed and updated. Data reviewed: Chart in Epic.   Past Medical History:  Diagnosis Date  . Hormone disorder    hypothyroidism  . Hyperlipidemia   . Insomnia   . Osteopenia 08/2016   T score -1.9 FRAX 22%/12%  . Thyroid disease    hypothyroid    Past Surgical History:  Procedure Laterality Date  . BREAST CYST ASPIRATION Left 2002  . NO PAST SURGERIES      Social History   Socioeconomic History  . Marital status: Divorced    Spouse name: Not on file  .  Number of children: 1  . Years of education: 10th grade-then got her GED  . Highest education level: GED or equivalent  Occupational History  . Not on file  Tobacco Use  . Smoking status: Never  . Smokeless tobacco: Never  Vaping Use  . Vaping Use: Never used  Substance and Sexual Activity  . Alcohol use: No  . Drug use: No  . Sexual activity: Not Currently    Birth control/protection: None    Comment: 1st intercourse- 16, partners- 4, divorced  Other Topics Concern  . Not on file  Social History Narrative  . Not on file   Social Determinants of Health   Financial Resource Strain: Low Risk  (05/18/2022)   Overall Financial Resource Strain (CARDIA)   . Difficulty of Paying Living Expenses: Not hard at all  Food Insecurity: No Food Insecurity (05/18/2022)   Hunger Vital Sign   . Worried About Programme researcher, broadcasting/film/video in the Last Year: Never true   . Ran Out of Food in the Last Year: Never true  Transportation Needs: No Transportation Needs (05/18/2022)   PRAPARE - Transportation   . Lack of Transportation (Medical): No   . Lack of Transportation (Non-Medical): No  Physical Activity: Insufficiently Active (05/18/2022)   Exercise Vital Sign   . Days of Exercise per Week: 3 days   . Minutes of Exercise per Session: 30 min  Stress: No Stress Concern Present (  05/18/2022)   [employer] of Occupational Health - Occupational Stress Questionnaire   . Feeling of Stress : Not at all  Social Connections: Moderately Isolated (05/18/2022)   Social Connection and Isolation Panel [NHANES]   . Frequency of Communication with Friends and Family: More than three times a week   . Frequency of Social Gatherings with Friends and Family: More than three times a week   . Attends Religious Services: More than 4 times per year   . Active Member of Clubs or Organizations: No   . Attends Banker Meetings: Never   . Marital Status: Divorced  Catering manager Violence: Not At Risk  (05/18/2022)   Humiliation, Afraid, Rape, and Kick questionnaire   . Fear of Current or Ex-Partner: No   . Emotionally Abused: No   . Physically Abused: No   . Sexually Abused: No    Outpatient Encounter Medications as of 07/12/2022  Medication Sig  . calcium carbonate (OS-CAL) 600 MG TABS Take 600 mg by mouth daily with breakfast.   . levothyroxine (SYNTHROID) 75 MCG tablet Take 1 tablet (75 mcg total) by mouth daily.  . Omega-3 1000 MG CAPS Take by mouth.  . polyethylene glycol powder (GLYCOLAX/MIRALAX) 17 GM/SCOOP powder Take 17 g by mouth daily.   No facility-administered encounter medications on file as of 07/12/2022.    No Known Allergies  Review of Systems  Constitutional:  Positive for activity change. Negative for appetite change, chills, diaphoresis, fatigue, fever, unexpected weight change and weight loss.  HENT: Negative.    Eyes: Negative.   Respiratory: Negative.  Negative for cough and shortness of breath.   Cardiovascular: Negative.  Negative for chest pain, palpitations and leg swelling.  Gastrointestinal: Negative.  Negative for abdominal pain and bowel incontinence.  Endocrine: Negative.   Genitourinary:  Negative for bladder incontinence, decreased urine volume, difficulty urinating, dysuria and pelvic pain.  Musculoskeletal:  Positive for arthralgias, back pain and myalgias. Negative for gait problem, joint swelling, neck pain and neck stiffness.  Allergic/Immunologic: Negative.   Neurological: Negative.  Negative for dizziness, tingling, tremors, seizures, syncope, facial asymmetry, speech difficulty, weakness, light-headedness, numbness, headaches and paresthesias.  Hematological: Negative.   Psychiatric/Behavioral: Negative.  Negative for confusion.   All other systems reviewed and are negative.       Objective:  Ht 5\' 9"  (1.753 m)   BMI 17.43 kg/m    Wt Readings from Last 3 Encounters:  05/18/22 118 lb (53.5 kg)  02/17/22 118 lb 12.8 oz (53.9 kg)   09/14/21 125 lb (56.7 kg)    Physical Exam Vitals and nursing note reviewed.  HENT:     Head: Normocephalic and atraumatic.     Nose: Nose normal.     Mouth/Throat:     Mouth: Mucous membranes are moist.     Pharynx: Oropharynx is clear.  Eyes:     Extraocular Movements: Extraocular movements intact.     Conjunctiva/sclera: Conjunctivae normal.     Pupils: Pupils are equal, round, and reactive to light.  Cardiovascular:     Rate and Rhythm: Normal rate and regular rhythm.  Pulmonary:     Effort: Pulmonary effort is normal.     Breath sounds: Normal breath sounds.  Abdominal:     General: Abdomen is flat.     Palpations: Abdomen is soft.  Musculoskeletal:     Right shoulder: Tenderness present. Decreased range of motion. Decreased strength.     Right upper arm: Normal.     Cervical  back: Normal and normal range of motion.     Thoracic back: Spasms and tenderness present. No swelling, edema, deformity, signs of trauma, lacerations or bony tenderness. Normal range of motion. No scoliosis.     Lumbar back: Normal.       Back:  Skin:    General: Skin is warm and dry.     Capillary Refill: Capillary refill takes less than 2 seconds.  Neurological:     General: No focal deficit present.     Mental Status: She is alert and oriented to person, place, and time.  Psychiatric:        Mood and Affect: Mood normal.        Behavior: Behavior normal.        Thought Content: Thought content normal.        Judgment: Judgment normal.    Results for orders placed or performed in visit on 04/20/22  HM COLONOSCOPY  Result Value Ref Range   HM Colonoscopy See Report (in chart) See Report (in chart), Patient Reported       Pertinent labs & imaging results that were available during my care of the patient were reviewed by me and considered in my medical decision making.  Assessment & Plan:   Back pain further  localized to R Posterior shoulder pain with pain radiating from 5th ICS   along posterior axillary line. TTP at 5th ICS  and posterior shoulder. Positive apprehension test with arm laterally raised and elbow bent and passively rotated to rear. Notably decreased ROM compared to Left arm. Decreased ROM and strength with active lateral arm raise. Positive pain with medial rotation of hand with arm straight forward.   Physical therapy consult placed. Patient will be call to set up appointment.   Naproxen 500mg  BID PO. Take with food to prevent GI upset.  The patient was advised that NSAID-type medications have two very important potential side effects: gastrointestinal irritation including hemorrhage and renal injuries. She was asked to take the medication with food and to stop if she experiences any GI upset. I asked her to call for vomiting, abdominal pain or black/bloody stools. The patient expresses understanding of these issues and questions were answered.   Continue all other maintenance medications.  Follow up plan: Return if symptoms worsen or fail to improve.  Return to clinic in 6 weeks for followup of Shoulder and back pain   Continue healthy lifestyle choices, including diet (rich in fruits, vegetables, and lean proteins, and low in salt and simple carbohydrates) and exercise (at least 30 minutes of moderate physical activity daily).  Educational handout given for back pain  The above assessment and management plan was discussed with the patient. The patient verbalized understanding of and has agreed to the management plan. Patient is aware to call the clinic if they develop any new symptoms or if symptoms persist or worsen. Patient is aware when to return to the clinic for a follow-up visit. Patient educated on when it is appropriate to go to the emergency department.   Maryelizabeth Kaufmann NP student [facility] [phone]   I personally was present during the history, physical exam, and medical decision-making activities of this  visit and have verified that the services and findings are accurately documented in the nurse practitioner student's note.  Kari Baars, FNP-C [facility] [address] [place], [place] [address] [phone]

## 2022-07-13 ENCOUNTER — Telehealth: Payer: Self-pay | Admitting: Family Medicine

## 2022-07-13 NOTE — Telephone Encounter (Signed)
Patient aware and verbalizes understanding. 

## 2022-07-13 NOTE — Telephone Encounter (Signed)
Returned patients call -   States she spoke to the student before Marcelino Duster and he told her there was no x ray at the time of her visit.  She forgot to ask Marcelino Duster when she came in if she needed to come back for x ray?

## 2022-07-13 NOTE — Telephone Encounter (Signed)
If symptoms do not improve, you can return for imaging.

## 2022-07-19 ENCOUNTER — Ambulatory Visit: Payer: Medicare HMO | Attending: Family Medicine | Admitting: Physical Therapy

## 2022-07-19 ENCOUNTER — Other Ambulatory Visit: Payer: Self-pay

## 2022-07-19 DIAGNOSIS — R293 Abnormal posture: Secondary | ICD-10-CM | POA: Diagnosis not present

## 2022-07-19 DIAGNOSIS — M62830 Muscle spasm of back: Secondary | ICD-10-CM | POA: Diagnosis not present

## 2022-07-19 DIAGNOSIS — M25511 Pain in right shoulder: Secondary | ICD-10-CM | POA: Insufficient documentation

## 2022-07-19 DIAGNOSIS — M549 Dorsalgia, unspecified: Secondary | ICD-10-CM | POA: Diagnosis not present

## 2022-07-19 DIAGNOSIS — M25512 Pain in left shoulder: Secondary | ICD-10-CM | POA: Diagnosis not present
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
## 2022-07-21 ENCOUNTER — Ambulatory Visit: Payer: Medicare HMO | Admitting: Physical Therapy

## 2022-07-21 DIAGNOSIS — M62830 Muscle spasm of back: Secondary | ICD-10-CM

## 2022-07-21 DIAGNOSIS — R293 Abnormal posture: Secondary | ICD-10-CM | POA: Diagnosis not present

## 2022-07-21 DIAGNOSIS — M25511 Pain in right shoulder: Secondary | ICD-10-CM | POA: Diagnosis not present

## 2022-07-21 DIAGNOSIS — M25512 Pain in left shoulder: Secondary | ICD-10-CM

## 2022-07-21 DIAGNOSIS — M549 Dorsalgia, unspecified: Secondary | ICD-10-CM | POA: Diagnosis not present
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
## 2022-07-25 ENCOUNTER — Ambulatory Visit: Payer: Medicare HMO | Admitting: Physical Therapy

## 2022-07-25 ENCOUNTER — Encounter: Payer: Self-pay | Admitting: Physical Therapy

## 2022-07-25 DIAGNOSIS — M25511 Pain in right shoulder: Secondary | ICD-10-CM | POA: Diagnosis not present

## 2022-07-25 DIAGNOSIS — M25512 Pain in left shoulder: Secondary | ICD-10-CM | POA: Diagnosis not present

## 2022-07-25 DIAGNOSIS — R293 Abnormal posture: Secondary | ICD-10-CM

## 2022-07-25 DIAGNOSIS — M62830 Muscle spasm of back: Secondary | ICD-10-CM

## 2022-07-25 DIAGNOSIS — M549 Dorsalgia, unspecified: Secondary | ICD-10-CM | POA: Diagnosis not present
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
## 2022-07-28 ENCOUNTER — Ambulatory Visit: Payer: Medicare HMO | Admitting: Physical Therapy

## 2022-07-28 ENCOUNTER — Encounter: Payer: Self-pay | Admitting: Physical Therapy

## 2022-07-28 DIAGNOSIS — M62830 Muscle spasm of back: Secondary | ICD-10-CM | POA: Diagnosis not present

## 2022-07-28 DIAGNOSIS — M25512 Pain in left shoulder: Secondary | ICD-10-CM | POA: Diagnosis not present

## 2022-07-28 DIAGNOSIS — R293 Abnormal posture: Secondary | ICD-10-CM

## 2022-07-28 DIAGNOSIS — M25511 Pain in right shoulder: Secondary | ICD-10-CM | POA: Diagnosis not present

## 2022-07-28 DIAGNOSIS — M549 Dorsalgia, unspecified: Secondary | ICD-10-CM | POA: Diagnosis not present
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
## 2022-08-02 ENCOUNTER — Ambulatory Visit: Payer: Medicare HMO | Admitting: Physical Therapy

## 2022-08-02 DIAGNOSIS — M25512 Pain in left shoulder: Secondary | ICD-10-CM | POA: Diagnosis not present

## 2022-08-02 DIAGNOSIS — R293 Abnormal posture: Secondary | ICD-10-CM | POA: Insufficient documentation

## 2022-08-02 DIAGNOSIS — M62830 Muscle spasm of back: Secondary | ICD-10-CM | POA: Insufficient documentation
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
## 2022-08-08 ENCOUNTER — Ambulatory Visit: Payer: Medicare HMO

## 2022-08-08 DIAGNOSIS — R293 Abnormal posture: Secondary | ICD-10-CM | POA: Diagnosis not present

## 2022-08-08 DIAGNOSIS — M25512 Pain in left shoulder: Secondary | ICD-10-CM

## 2022-08-08 DIAGNOSIS — M62830 Muscle spasm of back: Secondary | ICD-10-CM
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
## 2022-08-11 ENCOUNTER — Encounter: Payer: Self-pay | Admitting: Physical Therapy

## 2022-08-11 ENCOUNTER — Ambulatory Visit: Payer: Medicare HMO | Admitting: Physical Therapy

## 2022-08-11 DIAGNOSIS — M25512 Pain in left shoulder: Secondary | ICD-10-CM

## 2022-08-11 DIAGNOSIS — M62830 Muscle spasm of back: Secondary | ICD-10-CM | POA: Diagnosis not present

## 2022-08-11 DIAGNOSIS — R293 Abnormal posture: Secondary | ICD-10-CM
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
## 2022-08-15 ENCOUNTER — Ambulatory Visit: Payer: Medicare HMO

## 2022-08-15 DIAGNOSIS — M62830 Muscle spasm of back: Secondary | ICD-10-CM | POA: Diagnosis not present

## 2022-08-15 DIAGNOSIS — R293 Abnormal posture: Secondary | ICD-10-CM | POA: Diagnosis not present

## 2022-08-15 DIAGNOSIS — M25512 Pain in left shoulder: Secondary | ICD-10-CM
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
## 2022-09-06 ENCOUNTER — Ambulatory Visit: Payer: Medicare HMO | Admitting: Family Medicine

## 2022-09-06 ENCOUNTER — Encounter: Payer: Self-pay | Admitting: Family Medicine

## 2022-09-06 VITALS — BP 128/79 | HR 62 | Temp 96.1°F | Ht 69.0 in | Wt 122.6 lb

## 2022-09-06 DIAGNOSIS — M549 Dorsalgia, unspecified: Secondary | ICD-10-CM

## 2022-09-06 DIAGNOSIS — M25511 Pain in right shoulder: Secondary | ICD-10-CM

## 2022-09-06 NOTE — Progress Notes (Signed)
Subjective:  Patient ID: Pamela Henderson, female    DOB: 1945-09-29, 77 y.o.   MRN: [id number]  Patient Care Team: Pamela Masters, FNP as PCP - General (Family Medicine)   Chief Complaint:  right shoulder pain  and Back Pain (X 6 weeks - states it is so much better )   HPI: Pamela Henderson is a 77 y.o. female presenting on 09/06/2022 for right shoulder pain  and Back Pain (X 6 weeks - states it is so much better )   Pt presents today for shoulder and back pain follow up. She was seen 07/12/2022 and treated with Naproxen 500 twice daily with food and physical therapy. She states she has completed both and feels great. States pain in shoulder and back has resolved.        Relevant past medical, surgical, family, and social history reviewed and updated as indicated.  Allergies and medications reviewed and updated. Data reviewed: Chart in Epic.   Past Medical History:  Diagnosis Date   Hormone disorder    hypothyroidism   Hyperlipidemia    Insomnia    Osteopenia 08/2016   T score -1.9 FRAX 22%/12%   Thyroid disease    hypothyroid    Past Surgical History:  Procedure Laterality Date   BREAST CYST ASPIRATION Left 2002   NO PAST SURGERIES      Social History   Socioeconomic History   Marital status: Divorced    Spouse name: Not on file   Number of children: 1   Years of education: 10th grade-then got her GED   Highest education level: GED or equivalent  Occupational History   Not on file  Tobacco Use   Smoking status: Never   Smokeless tobacco: Never  Vaping Use   Vaping status: Never Used  Substance and Sexual Activity   Alcohol use: No   Drug use: No   Sexual activity: Not Currently    Birth control/protection: None    Comment: 1st intercourse- 16, partners- 4, divorced  Other Topics Concern   Not on file  Social History Narrative   Not on file   Social Determinants of Health   Financial Resource Strain: Low Risk  (05/18/2022)   Overall Financial Resource  Strain (CARDIA)    Difficulty of Paying Living Expenses: Not hard at all  Food Insecurity: No Food Insecurity (05/18/2022)   Hunger Vital Sign    Worried About Running Out of Food in the Last Year: Never true    Ran Out of Food in the Last Year: Never true  Transportation Needs: No Transportation Needs (05/18/2022)   PRAPARE - Administrator, Civil Service (Medical): No    Lack of Transportation (Non-Medical): No  Physical Activity: Insufficiently Active (05/18/2022)   Exercise Vital Sign    Days of Exercise per Week: 3 days    Minutes of Exercise per Session: 30 min  Stress: No Stress Concern Present (05/18/2022)   [employer] of Occupational Health - Occupational Stress Questionnaire    Feeling of Stress : Not at all  Social Connections: Moderately Isolated (05/18/2022)   Social Connection and Isolation Panel [NHANES]    Frequency of Communication with Friends and Family: More than three times a week    Frequency of Social Gatherings with Friends and Family: More than three times a week    Attends Religious Services: More than 4 times per year    Active Member of Clubs or Organizations: No  Attends Banker Meetings: Never    Marital Status: Divorced  Catering manager Violence: Not At Risk (05/18/2022)   Humiliation, Afraid, Rape, and Kick questionnaire    Fear of Current or Ex-Partner: No    Emotionally Abused: No    Physically Abused: No    Sexually Abused: No    Outpatient Encounter Medications as of 09/06/2022  Medication Sig   calcium carbonate (OS-CAL) 600 MG TABS Take 600 mg by mouth daily with breakfast.    levothyroxine (SYNTHROID) 75 MCG tablet Take 1 tablet (75 mcg total) by mouth daily.   Omega-3 1000 MG CAPS Take by mouth.   polyethylene glycol powder (GLYCOLAX/MIRALAX) 17 GM/SCOOP powder Take 17 g by mouth daily.   No facility-administered encounter medications on file as of 09/06/2022.    No Known Allergies  Review of Systems  HENT:  Negative.    Respiratory:  Negative for cough, chest tightness and shortness of breath.   All other systems reviewed and are negative.       Objective:  BP 128/79   Pulse 62   Temp (!) 96.1 F (35.6 C) (Temporal)   Ht 5\' 9"  (1.753 m)   Wt 122 lb 9.6 oz (55.6 kg)   SpO2 97%   BMI 18.10 kg/m    Wt Readings from Last 3 Encounters:  09/06/22 122 lb 9.6 oz (55.6 kg)  07/12/22 122 lb 9.6 oz (55.6 kg)  05/18/22 118 lb (53.5 kg)    Physical Exam Vitals and nursing note reviewed.  Constitutional:      General: She is not in acute distress.    Appearance: Normal appearance. She is not ill-appearing, toxic-appearing or diaphoretic.  HENT:     Head: Normocephalic and atraumatic.     Nose: Nose normal.     Mouth/Throat:     Mouth: Mucous membranes are moist.  Eyes:     Conjunctiva/sclera: Conjunctivae normal.     Pupils: Pupils are equal, round, and reactive to light.  Cardiovascular:     Rate and Rhythm: Regular rhythm.     Heart sounds: Normal heart sounds.  Pulmonary:     Effort: Pulmonary effort is normal.     Breath sounds: Normal breath sounds.  Musculoskeletal:        General: Normal range of motion.     Right lower leg: No edema.     Left lower leg: No edema.  Skin:    General: Skin is warm and dry.     Capillary Refill: Capillary refill takes less than 2 seconds.  Neurological:     General: No focal deficit present.     Mental Status: She is alert and oriented to person, place, and time.  Psychiatric:        Mood and Affect: Mood normal.        Behavior: Behavior normal.        Thought Content: Thought content normal.        Judgment: Judgment normal.     Results for orders placed or performed in visit on 04/20/22  HM COLONOSCOPY  Result Value Ref Range   HM Colonoscopy See Report (in chart) See Report (in chart), Patient Reported       Pertinent labs & imaging results that were available during my care of the patient were reviewed by me and considered  in my medical decision making.  Assessment & Plan:  Pamela Henderson was seen today for right shoulder pain  and back pain.  Diagnoses and all orders  for this visit:  Acute pain of right shoulder Other acute back pain Resolved with PT. States she is feeling much better. Aware to continue exercises at home.     Continue all other maintenance medications.  Follow up plan: Return in about 3 months (around 12/07/2022), or if symptoms worsen or fail to improve, for chronic follow up.   Continue healthy lifestyle choices, including diet (rich in fruits, vegetables, and lean proteins, and low in salt and simple carbohydrates) and exercise (at least 30 minutes of moderate physical activity daily).  Educational handout given for back pain  The above assessment and management plan was discussed with the patient. The patient verbalized understanding of and has agreed to the management plan. Patient is aware to call the clinic if they develop any new symptoms or if symptoms persist or worsen. Patient is aware when to return to the clinic for a follow-up visit. Patient educated on when it is appropriate to go to the emergency department.   Kari Baars, FNP-C [facility] [phone]

## 2022-09-09 ENCOUNTER — Other Ambulatory Visit: Payer: Self-pay | Admitting: Family Medicine

## 2022-09-09 DIAGNOSIS — E039 Hypothyroidism, unspecified: Secondary | ICD-10-CM

## 2022-11-09 DIAGNOSIS — L570 Actinic keratosis: Secondary | ICD-10-CM | POA: Diagnosis not present

## 2022-11-09 DIAGNOSIS — D1801 Hemangioma of skin and subcutaneous tissue: Secondary | ICD-10-CM | POA: Diagnosis not present

## 2022-11-09 DIAGNOSIS — L821 Other seborrheic keratosis: Secondary | ICD-10-CM | POA: Diagnosis not present

## 2022-11-09 DIAGNOSIS — Z85828 Personal history of other malignant neoplasm of skin: Secondary | ICD-10-CM | POA: Diagnosis not present

## 2022-11-15 DIAGNOSIS — H43813 Vitreous degeneration, bilateral: Secondary | ICD-10-CM | POA: Diagnosis not present

## 2022-11-15 DIAGNOSIS — Z961 Presence of intraocular lens: Secondary | ICD-10-CM | POA: Diagnosis not present

## 2022-11-15 DIAGNOSIS — L821 Other seborrheic keratosis: Secondary | ICD-10-CM | POA: Diagnosis not present

## 2022-11-15 DIAGNOSIS — H2512 Age-related nuclear cataract, left eye: Secondary | ICD-10-CM | POA: Diagnosis not present

## 2022-11-15 DIAGNOSIS — H02052 Trichiasis without entropian right lower eyelid: Secondary | ICD-10-CM | POA: Diagnosis not present

## 2022-11-15 DIAGNOSIS — H0102A Squamous blepharitis right eye, upper and lower eyelids: Secondary | ICD-10-CM | POA: Diagnosis not present

## 2022-11-15 DIAGNOSIS — H0102B Squamous blepharitis left eye, upper and lower eyelids: Secondary | ICD-10-CM | POA: Diagnosis not present

## 2022-12-07 ENCOUNTER — Ambulatory Visit: Payer: Medicare HMO | Admitting: Family Medicine

## 2022-12-07 ENCOUNTER — Encounter: Payer: Self-pay | Admitting: Family Medicine

## 2022-12-07 VITALS — BP 116/75 | HR 84 | Temp 98.1°F | Ht 69.0 in | Wt 118.2 lb

## 2022-12-07 DIAGNOSIS — K5901 Slow transit constipation: Secondary | ICD-10-CM

## 2022-12-07 DIAGNOSIS — R918 Other nonspecific abnormal finding of lung field: Secondary | ICD-10-CM

## 2022-12-07 DIAGNOSIS — Z13820 Encounter for screening for osteoporosis: Secondary | ICD-10-CM

## 2022-12-07 DIAGNOSIS — M8589 Other specified disorders of bone density and structure, multiple sites: Secondary | ICD-10-CM

## 2022-12-07 DIAGNOSIS — Z780 Asymptomatic menopausal state: Secondary | ICD-10-CM

## 2022-12-07 DIAGNOSIS — E782 Mixed hyperlipidemia: Secondary | ICD-10-CM | POA: Diagnosis not present

## 2022-12-07 DIAGNOSIS — Z23 Encounter for immunization: Secondary | ICD-10-CM

## 2022-12-07 DIAGNOSIS — F5101 Primary insomnia: Secondary | ICD-10-CM | POA: Diagnosis not present

## 2022-12-07 DIAGNOSIS — E039 Hypothyroidism, unspecified: Secondary | ICD-10-CM

## 2022-12-07 NOTE — Patient Instructions (Signed)
Paula's Choice AHA/BHA solution.

## 2022-12-07 NOTE — Progress Notes (Signed)
Subjective:  Patient ID: Pamela Henderson, female    DOB: 02/07/1945, 77 y.o.   MRN: [id number]  Patient Care Team: Sonny Masters, FNP as PCP - General (Family Medicine)   Chief Complaint:  Medical Management of Chronic Issues (3 month follow up )   HPI: Pamela Henderson is a 77 y.o. female presenting on 12/07/2022 for Medical Management of Chronic Issues (3 month follow up )   Discussed the use of AI scribe software for clinical note transcription with the patient, who gave verbal consent to proceed.  History of Present Illness   Pamela Henderson, a patient with a history of osteopenia and thyroid disease, presents for a follow-up visit. She reports improvement in her left shoulder pain following physical therapy. However, she notes that she still experiences discomfort when performing certain activities, necessitating periods of rest.  She inquires about the frequency of her lung x-rays, with the last one conducted in February. She denies any shortness of breath or other respiratory symptoms.  Pamela Henderson also reports poor sleep quality, a longstanding issue for her. She has been experiencing constipation, which she attempts to manage with a laxative and stool softener, though she admits to not taking the Miralax powder daily as recommended.  She confirms adherence to her prescribed medications, including Os-Cal and levothyroxine. She denies any new medications or thyroid-related symptoms.  Pamela Henderson also mentions concerns about skin lesions, which have been present for a while. These lesions are described as keratotic and are not bothersome to her.  She denies any recent falls, changes in vision or hearing, and any new fractures. Her last DEXA scan was in 2018, and she is due for another. She is also due for her pneumonia and flu vaccines.        Relevant past medical, surgical, family, and social history reviewed and updated as indicated.  Allergies and medications reviewed and updated. Data reviewed:  Chart in Epic.   Past Medical History:  Diagnosis Date   Hormone disorder    hypothyroidism   Hyperlipidemia    Insomnia    Osteopenia 08/2016   T score -1.9 FRAX 22%/12%   Thyroid disease    hypothyroid    Past Surgical History:  Procedure Laterality Date   BREAST CYST ASPIRATION Left 2002   NO PAST SURGERIES      Social History   Socioeconomic History   Marital status: Divorced    Spouse name: Not on file   Number of children: 1   Years of education: 10th grade-then got her GED   Highest education level: GED or equivalent  Occupational History   Not on file  Tobacco Use   Smoking status: Never   Smokeless tobacco: Never  Vaping Use   Vaping status: Never Used  Substance and Sexual Activity   Alcohol use: No   Drug use: No   Sexual activity: Not Currently    Birth control/protection: None    Comment: 1st intercourse- 16, partners- 4, divorced  Other Topics Concern   Not on file  Social History Narrative   Not on file   Social Determinants of Health   Financial Resource Strain: Low Risk  (05/18/2022)   Overall Financial Resource Strain (CARDIA)    Difficulty of Paying Living Expenses: Not hard at all  Food Insecurity: No Food Insecurity (05/18/2022)   Hunger Vital Sign    Worried About Running Out of Food in the Last Year: Never true    Ran Out of Food in  the Last Year: Never true  Transportation Needs: No Transportation Needs (05/18/2022)   PRAPARE - Administrator, Civil Service (Medical): No    Lack of Transportation (Non-Medical): No  Physical Activity: Insufficiently Active (05/18/2022)   Exercise Vital Sign    Days of Exercise per Week: 3 days    Minutes of Exercise per Session: 30 min  Stress: No Stress Concern Present (05/18/2022)   [employer] of Occupational Health - Occupational Stress Questionnaire    Feeling of Stress : Not at all  Social Connections: Moderately Isolated (05/18/2022)   Social Connection and Isolation Panel  [NHANES]    Frequency of Communication with Friends and Family: More than three times a week    Frequency of Social Gatherings with Friends and Family: More than three times a week    Attends Religious Services: More than 4 times per year    Active Member of [employer] or Organizations: No    Attends Banker Meetings: Never    Marital Status: Divorced  Catering manager Violence: Not At Risk (05/18/2022)   Humiliation, Afraid, Rape, and Kick questionnaire    Fear of Current or Ex-Partner: No    Emotionally Abused: No    Physically Abused: No    Sexually Abused: No    Outpatient Encounter Medications as of 12/07/2022  Medication Sig   calcium carbonate (OS-CAL) 600 MG TABS Take 600 mg by mouth daily with breakfast.    levothyroxine (SYNTHROID) 75 MCG tablet TAKE 1 TABLET BY MOUTH EVERY DAY   Omega-3 1000 MG CAPS Take by mouth.   polyethylene glycol powder (GLYCOLAX/MIRALAX) 17 GM/SCOOP powder Take 17 g by mouth daily.   No facility-administered encounter medications on file as of 12/07/2022.    No Known Allergies  Pertinent ROS per HPI, otherwise unremarkable      Objective:  BP 116/75   Pulse 84   Temp 98.1 F (36.7 C)   Ht 5\' 9"  (1.753 m)   Wt 118 lb 3.2 oz (53.6 kg)   SpO2 96%   BMI 17.46 kg/m    Wt Readings from Last 3 Encounters:  12/07/22 118 lb 3.2 oz (53.6 kg)  09/06/22 122 lb 9.6 oz (55.6 kg)  07/12/22 122 lb 9.6 oz (55.6 kg)    Physical Exam Vitals and nursing note reviewed.  Constitutional:      General: She is not in acute distress.    Appearance: Normal appearance. She is normal weight. She is not ill-appearing, toxic-appearing or diaphoretic.  HENT:     Head: Normocephalic and atraumatic.     Right Ear: Tympanic membrane, ear canal and external ear normal.     Left Ear: Tympanic membrane, ear canal and external ear normal.     Nose: Nose normal.     Mouth/Throat:     Mouth: Mucous membranes are moist.  Eyes:     Extraocular Movements:  Extraocular movements intact.     Conjunctiva/sclera: Conjunctivae normal.     Pupils: Pupils are equal, round, and reactive to light.  Cardiovascular:     Rate and Rhythm: Normal rate and regular rhythm.     Heart sounds: Normal heart sounds.  Pulmonary:     Effort: Pulmonary effort is normal.     Breath sounds: Normal breath sounds.  Abdominal:     General: Bowel sounds are normal.     Palpations: Abdomen is soft.  Musculoskeletal:     Cervical back: Normal range of motion and neck supple. No tenderness.  Right lower leg: No edema.     Left lower leg: No edema.  Lymphadenopathy:     Cervical: No cervical adenopathy.  Skin:    General: Skin is warm and dry.     Capillary Refill: Capillary refill takes less than 2 seconds.     Findings: Lesion present.  Neurological:     General: No focal deficit present.     Mental Status: She is alert and oriented to person, place, and time.  Psychiatric:        Mood and Affect: Mood normal.        Behavior: Behavior normal.        Thought Content: Thought content normal.        Judgment: Judgment normal.    Physical Exam   HEENT: Ears normal bilaterally. Throat normal. NECK: Thyroid normal. CHEST: Lungs clear to auscultation. CARDIOVASCULAR: Heart sounds normal. EXTREMITIES: No leg edema. SKIN: Seborrheic keratosis with keratin buildup.        Results for orders placed or performed in visit on 04/20/22  HM COLONOSCOPY  Result Value Ref Range   HM Colonoscopy See Report (in chart) See Report (in chart), Patient Reported       Pertinent labs & imaging results that were available during my care of the patient were reviewed by me and considered in my medical decision making.  Assessment & Plan:  Marjon was seen today for medical management of chronic issues.  Diagnoses and all orders for this visit:  Acquired hypothyroidism -     Thyroid Panel With TSH  Osteopenia of multiple sites -     CMP14+EGFR -     VITAMIN D 25  Hydroxy (Vit-D Deficiency, Fractures)  Primary insomnia -     CMP14+EGFR -     CBC with Differential/Platelet -     Thyroid Panel With TSH  Slow transit constipation  Mixed hyperlipidemia -     CMP14+EGFR -     Lipid panel  Encounter for osteoporosis screening in asymptomatic postmenopausal patient -     DG [facility] DEXA; Future  Multiple lung nodules on CT -     CT Chest Wo Contrast; Future     Assessment and Plan    Left Shoulder Pain Improved with physical therapy, but still causing some functional limitations. -Continue current management and rest as needed.  Lung Nodules Stable on last imaging in February 2024, no new symptoms. -Order CT chest for follow-up in February-March 2025.  Constipation Despite use of laxatives and stool softeners, still experiencing constipation. -Increase Miralax to daily use. -Encourage increased water intake.  Osteopenia Last DEXA scan in 2018, no recent fractures. -Order DEXA scan today if available, if not schedule for future date.  Seborrheic Keratosis Noticed new skin lesions, no discomfort. -Recommend over-the-counter Paula's Choice AHA/BHA solution for topical treatment.  General Health Maintenance -Administer influenza and pneumonia vaccines today. -Check cholesterol levels today. -Continue levothyroxine for thyroid management. -Order vision check in January 2025.          Continue all other maintenance medications.  Follow up plan: Return in about 6 months (around 06/06/2023), or if symptoms worsen or fail to improve, for chronic follow up.   Continue healthy lifestyle choices, including diet (rich in fruits, vegetables, and lean proteins, and low in salt and simple carbohydrates) and exercise (at least 30 minutes of moderate physical activity daily).   The above assessment and management plan was discussed with the patient. The patient verbalized understanding of and has agreed  to the management plan. Patient is aware  to call the clinic if they develop any new symptoms or if symptoms persist or worsen. Patient is aware when to return to the clinic for a follow-up visit. Patient educated on when it is appropriate to go to the emergency department.   Kari Baars, FNP-C [facility] [phone]

## 2022-12-07 NOTE — Addendum Note (Signed)
Addended by: Lorelee Cover C on: 12/07/2022 10:35 AM   Modules accepted: Orders

## 2022-12-08 LAB — CBC WITH DIFFERENTIAL/PLATELET
Basophils Absolute: 0 10*3/uL (ref 0.0–0.2)
Basos: 0 %
EOS (ABSOLUTE): 0.1 10*3/uL (ref 0.0–0.4)
Eos: 2 %
Hematocrit: 45.3 % (ref 34.0–46.6)
Hemoglobin: 14.6 g/dL (ref 11.1–15.9)
Immature Grans (Abs): 0 10*3/uL (ref 0.0–0.1)
Immature Granulocytes: 0 %
Lymphocytes Absolute: 2.2 10*3/uL (ref 0.7–3.1)
Lymphs: 40 %
MCH: 29.9 pg (ref 26.6–33.0)
MCHC: 32.2 g/dL (ref 31.5–35.7)
MCV: 93 fL (ref 79–97)
Monocytes Absolute: 0.4 10*3/uL (ref 0.1–0.9)
Monocytes: 6 %
Neutrophils Absolute: 2.9 10*3/uL (ref 1.4–7.0)
Neutrophils: 52 %
Platelets: 249 10*3/uL (ref 150–450)
RBC: 4.88 x10E6/uL (ref 3.77–5.28)
RDW: 12.8 % (ref 11.7–15.4)
WBC: 5.5 10*3/uL (ref 3.4–10.8)

## 2022-12-08 LAB — LIPID PANEL
Chol/HDL Ratio: 3.8 ratio (ref 0.0–4.4)
Cholesterol, Total: 254 mg/dL — ABNORMAL HIGH (ref 100–199)
HDL: 67 mg/dL (ref >39)
LDL Chol Calc (NIH): 172 mg/dL — ABNORMAL HIGH (ref 0–99)
Triglycerides: 88 mg/dL (ref 0–149)
VLDL Cholesterol Cal: 15 mg/dL (ref 5–40)

## 2022-12-08 LAB — CMP14+EGFR
ALT: 10 [IU]/L (ref 0–32)
AST: 22 [IU]/L (ref 0–40)
Albumin: 4.2 g/dL (ref 3.8–4.8)
Alkaline Phosphatase: 104 IU/L (ref 44–121)
BUN/Creatinine Ratio: 14 (ref 12–28)
BUN: 11 mg/dL (ref 8–27)
Bilirubin Total: 0.5 mg/dL (ref 0.0–1.2)
CO2: 23 mmol/L (ref 20–29)
Calcium: 9.5 mg/dL (ref 8.7–10.3)
Chloride: 105 mmol/L (ref 96–106)
Creatinine, Ser: 0.79 mg/dL (ref 0.57–1.00)
Globulin, Total: 2.4 g/dL (ref 1.5–4.5)
Glucose: 95 mg/dL (ref 70–99)
Potassium: 4.3 mmol/L (ref 3.5–5.2)
Sodium: 143 mmol/L (ref 134–144)
Total Protein: 6.6 g/dL (ref 6.0–8.5)
eGFR: 76 mL/min/{1.73_m2} (ref >59)

## 2022-12-08 LAB — THYROID PANEL WITH TSH
Free Thyroxine Index: 2.1 (ref 1.2–4.9)
T3 Uptake Ratio: 26 % (ref 24–39)
T4, Total: 7.9 ug/dL (ref 4.5–12.0)
TSH: 3.5 u[IU]/mL (ref 0.450–4.500)

## 2022-12-08 LAB — VITAMIN D 25 HYDROXY (VIT D DEFICIENCY, FRACTURES): Vit D, 25-Hydroxy: 33.4 ng/mL (ref 30.0–100.0)

## 2022-12-13 ENCOUNTER — Ambulatory Visit (INDEPENDENT_AMBULATORY_CARE_PROVIDER_SITE_OTHER): Payer: Medicare HMO | Admitting: Obstetrics and Gynecology

## 2022-12-13 ENCOUNTER — Encounter: Payer: Self-pay | Admitting: Obstetrics and Gynecology

## 2022-12-13 ENCOUNTER — Ambulatory Visit: Payer: Medicare HMO | Admitting: Obstetrics & Gynecology

## 2022-12-13 VITALS — BP 98/70 | HR 84 | Ht 66.58 in | Wt 117.0 lb

## 2022-12-13 DIAGNOSIS — M8589 Other specified disorders of bone density and structure, multiple sites: Secondary | ICD-10-CM | POA: Diagnosis not present

## 2022-12-13 DIAGNOSIS — N907 Vulvar cyst: Secondary | ICD-10-CM | POA: Diagnosis not present

## 2022-12-13 DIAGNOSIS — Z01419 Encounter for gynecological examination (general) (routine) without abnormal findings: Secondary | ICD-10-CM | POA: Insufficient documentation

## 2022-12-13 NOTE — Assessment & Plan Note (Addendum)
Cervical cancer screening discontinued in 2019, pt in agreement Encouraged annual mammogram screening Colonoscopy UTD DXA ordered by PCP Labs and immunizations with her primary Encouraged safe sexual practices as indicated Encouraged healthy lifestyle practices with diet and exercise For patients over 79yo, I recommend 1200mg  calcium daily and 800IU of vitamin D daily.

## 2022-12-13 NOTE — Assessment & Plan Note (Signed)
Continue vitamin D+Calcium Encouraged weight based exercise DXA ordered by PCP

## 2022-12-13 NOTE — Patient Instructions (Signed)
For patients under 50-79yo, I recommend 1200mg  calcium daily and 600IU of vitamin D daily. For patients over 79yo, I recommend 1200mg  calcium daily and 800IU of vitamin D daily.  Health Maintenance, Female Adopting a healthy lifestyle and getting preventive care are important in promoting health and wellness. Ask your health care provider about: The right schedule for you to have regular tests and exams. Things you can do on your own to prevent diseases and keep yourself healthy. What should I know about diet, weight, and exercise? Eat a healthy diet  Eat a diet that includes plenty of vegetables, fruits, low-fat dairy products, and lean protein. Do not eat a lot of foods that are high in solid fats, added sugars, or sodium. Maintain a healthy weight Body mass index (BMI) is used to identify weight problems. It estimates body fat based on height and weight. Your health care provider can help determine your BMI and help you achieve or maintain a healthy weight. Get regular exercise Get regular exercise. This is one of the most important things you can do for your health. Most adults should: Exercise for at least 150 minutes each week. The exercise should increase your heart rate and make you sweat (moderate-intensity exercise). Do strengthening exercises at least twice a week. This is in addition to the moderate-intensity exercise. Spend less time sitting. Even light physical activity can be beneficial. Watch cholesterol and blood lipids Have your blood tested for lipids and cholesterol at 79 years of age, then have this test every 5 years. Have your cholesterol levels checked more often if: Your lipid or cholesterol levels are high. You are older than 79 years of age. You are at high risk for heart disease. What should I know about cancer screening? Depending on your health history and family history, you may need to have cancer screening at various ages. This may include screening  for: Breast cancer. Cervical cancer. Colorectal cancer. Skin cancer. Lung cancer. What should I know about heart disease, diabetes, and high blood pressure? Blood pressure and heart disease High blood pressure causes heart disease and increases the risk of stroke. This is more likely to develop in people who have high blood pressure readings or are overweight. Have your blood pressure checked: Every 3-5 years if you are 83-79 years of age. Every year if you are 4 years old or older. Diabetes Have regular diabetes screenings. This checks your fasting blood sugar level. Have the screening done: Once every three years after age 68 if you are at a normal weight and have a low risk for diabetes. More often and at a younger age if you are overweight or have a high risk for diabetes. What should I know about preventing infection? Hepatitis B If you have a higher risk for hepatitis B, you should be screened for this virus. Talk with your health care provider to find out if you are at risk for hepatitis B infection. Hepatitis C Testing is recommended for: Everyone born from [birth year] through 1967. Anyone with known risk factors for hepatitis C. Sexually transmitted infections (STIs) Get screened for STIs, including gonorrhea and chlamydia, if: You are sexually active and are younger than 79 years of age. You are older than 79 years of age and your health care provider tells you that you are at risk for this type of infection. Your sexual activity has changed since you were last screened, and you are at increased risk for chlamydia or gonorrhea. Ask your health care provider if  you are at risk. Ask your health care provider about whether you are at high risk for HIV. Your health care provider may recommend a prescription medicine to help prevent HIV infection. If you choose to take medicine to prevent HIV, you should first get tested for HIV. You should then be tested every 3 months for as long as you  are taking the medicine. Osteoporosis and menopause Osteoporosis is a disease in which the bones lose minerals and strength with aging. This can result in bone fractures. If you are 44 years old or older, or if you are at risk for osteoporosis and fractures, ask your health care provider if you should: Be screened for bone loss. Take a calcium or vitamin D supplement to lower your risk of fractures. Be given hormone replacement therapy (HRT) to treat symptoms of menopause. Follow these instructions at home: Alcohol use Do not drink alcohol if: Your health care provider tells you not to drink. You are pregnant, may be pregnant, or are planning to become pregnant. If you drink alcohol: Limit how much you have to: 0-1 drink a day. Know how much alcohol is in your drink. In the [place] one drink equals one 12 oz bottle of beer (355 mL), one 5 oz glass of wine (148 mL), or one 1 oz glass of hard liquor (44 mL). Lifestyle Do not use any products that contain nicotine or tobacco. These products include cigarettes, chewing tobacco, and vaping devices, such as e-cigarettes. If you need help quitting, ask your health care provider. Do not use street drugs. Do not share needles. Ask your health care provider for help if you need support or information about quitting drugs. General instructions Schedule regular health, dental, and eye exams. Stay current with your vaccines. Tell your health care provider if: You often feel depressed. You have ever been abused or do not feel safe at home. Summary Adopting a healthy lifestyle and getting preventive care are important in promoting health and wellness. Follow your health care provider's instructions about healthy diet, exercising, and getting tested or screened for diseases. Follow your health care provider's instructions on monitoring your cholesterol and blood pressure. This information is not intended to replace advice given to you by your health  care provider. Make sure you discuss any questions you have with your health care provider. Document Revised: 06/08/2020 Document Reviewed: 06/08/2020 Elsevier Patient Education  2024 [employer].

## 2022-12-13 NOTE — Progress Notes (Signed)
77 y.o. G1P1 postmenopausal female with osteopenia here for annual exam. Divorced. Retired Marine scientist.  Pt c/o of bump near vaginal area.   Postmenopausal bleeding: none Pelvic discharge or pain: none Breast mass, nipple discharge or skin changes : none Last PAP: 2019 NIL, discontinued screening Last mammogram: 06/02/22 BIRADS 1, density b Last colonoscopy: 03/28/22 +adenoma and polyp Last DXA: 09/06/16, osteopenia with increased fracture risk Sexually active: no  Exercising: 3 day/wk for 15-30 mins, yoga and biking  GYN HISTORY: No significant history  OB History  Gravida Para Term Preterm AB Living  1 1       1   SAB IAB Ectopic Multiple Live Births               # Outcome Date GA Lbr Len/2nd Weight Sex Type Anes PTL Lv  1 Para             Past Medical History:  Diagnosis Date   Hormone disorder    hypothyroidism   Hyperlipidemia    Insomnia    Osteopenia 08/2016   T score -1.9 FRAX 22%/12%   Thyroid disease    hypothyroid    Past Surgical History:  Procedure Laterality Date   BREAST CYST ASPIRATION Left 2002   NO PAST SURGERIES      Current Outpatient Medications on File Prior to Visit  Medication Sig Dispense Refill   levothyroxine (SYNTHROID) 75 MCG tablet TAKE 1 TABLET BY MOUTH EVERY DAY 90 tablet 1   polyethylene glycol powder (GLYCOLAX/MIRALAX) 17 GM/SCOOP powder Take 17 g by mouth daily. 3350 g 1   No current facility-administered medications on file prior to visit.    Social History   Socioeconomic History   Marital status: Divorced    Spouse name: Not on file   Number of children: 1   Years of education: 10th grade-then got her GED   Highest education level: GED or equivalent  Occupational History   Not on file  Tobacco Use   Smoking status: Never   Smokeless tobacco: Never  Vaping Use   Vaping status: Never Used  Substance and Sexual Activity   Alcohol use: No   Drug use: No   Sexual activity: Not Currently    Birth  control/protection: None    Comment: 1st intercourse- 16, partners- 4, divorced  Other Topics Concern   Not on file  Social History Narrative   Not on file   Social Determinants of Health   Financial Resource Strain: Low Risk  (05/18/2022)   Overall Financial Resource Strain (CARDIA)    Difficulty of Paying Living Expenses: Not hard at all  Food Insecurity: No Food Insecurity (05/18/2022)   Hunger Vital Sign    Worried About Running Out of Food in the Last Year: Never true    Ran Out of Food in the Last Year: Never true  Transportation Needs: No Transportation Needs (05/18/2022)   PRAPARE - Administrator, Civil Service (Medical): No    Lack of Transportation (Non-Medical): No  Physical Activity: Insufficiently Active (05/18/2022)   Exercise Vital Sign    Days of Exercise per Week: 3 days    Minutes of Exercise per Session: 30 min  Stress: No Stress Concern Present (05/18/2022)   [employer] of Occupational Health - Occupational Stress Questionnaire    Feeling of Stress : Not at all  Social Connections: Moderately Isolated (05/18/2022)   Social Connection and Isolation Panel [NHANES]    Frequency of Communication with Friends  and Family: More than three times a week    Frequency of Social Gatherings with Friends and Family: More than three times a week    Attends Religious Services: More than 4 times per year    Active Member of [employer] or Organizations: No    Attends Banker Meetings: Never    Marital Status: Divorced  Catering manager Violence: Not At Risk (05/18/2022)   Humiliation, Afraid, Rape, and Kick questionnaire    Fear of Current or Ex-Partner: No    Emotionally Abused: No    Physically Abused: No    Sexually Abused: No    Family History  Problem Relation Age of Onset   Cancer Father 58       lung- smoker   Hypertension Brother    Cancer Brother        lung   Stroke Mother    Breast cancer Maternal Grandmother     No Known  Allergies    PE Today's Vitals   12/13/22 1102  BP: 98/70  Pulse: 84  SpO2: 97%  Weight: 117 lb (53.1 kg)  Height: 5' 6.58" (1.691 m)   Body mass index is 18.56 kg/m.  Physical Exam Vitals reviewed. Exam conducted with a chaperone present.  Constitutional:      General: She is not in acute distress.    Appearance: Normal appearance.  HENT:     Head: Normocephalic and atraumatic.     Nose: Nose normal.  Eyes:     Extraocular Movements: Extraocular movements intact.     Conjunctiva/sclera: Conjunctivae normal.  Neck:     Thyroid: No thyroid mass, thyromegaly or thyroid tenderness.  Pulmonary:     Effort: Pulmonary effort is normal.  Chest:     Chest wall: No mass or tenderness.  Breasts:    Right: Normal. No swelling, mass, nipple discharge, skin change or tenderness.     Left: Normal. No swelling, mass, nipple discharge, skin change or tenderness.  Abdominal:     General: There is no distension.     Palpations: Abdomen is soft.     Tenderness: There is no abdominal tenderness.  Genitourinary:    General: Normal vulva.     Exam position: Lithotomy position.     Urethra: No prolapse.     Vagina: Normal. No vaginal discharge or bleeding.     Cervix: Normal. No lesion.     Uterus: Normal. Not enlarged and not tender.      Adnexa: Right adnexa normal and left adnexa normal.     Comments: Small (2mm) inclusion cyst of left vulva noted Musculoskeletal:        General: Normal range of motion.     Cervical back: Normal range of motion.  Lymphadenopathy:     Upper Body:     Right upper body: No axillary adenopathy.     Left upper body: No axillary adenopathy.     Lower Body: No right inguinal adenopathy. No left inguinal adenopathy.  Skin:    General: Skin is warm and dry.  Neurological:     General: No focal deficit present.     Mental Status: She is alert.  Psychiatric:        Mood and Affect: Mood normal.        Behavior: Behavior normal.      Assessment and  Plan:        Well woman exam with routine gynecological exam Assessment & Plan: Cervical cancer screening discontinued in 2019, pt in  agreement Encouraged annual mammogram screening Colonoscopy UTD DXA ordered by PCP Labs and immunizations with her primary Encouraged safe sexual practices as indicated Encouraged healthy lifestyle practices with diet and exercise For patients over 70yo, I recommend 1200mg  calcium daily and 800IU of vitamin D daily.    Osteopenia of multiple sites Assessment & Plan: Continue vitamin D+Calcium Encouraged weight based exercise DXA ordered by PCP     Rosalyn Gess, MD

## 2022-12-17 ENCOUNTER — Ambulatory Visit (HOSPITAL_COMMUNITY)
Admission: RE | Admit: 2022-12-17 | Discharge: 2022-12-17 | Disposition: A | Discharge status: Home / Self Care | Payer: Medicare HMO | Source: Ambulatory Visit | Attending: Family Medicine | Admitting: Family Medicine

## 2022-12-17 DIAGNOSIS — Q676 Pectus excavatum: Secondary | ICD-10-CM | POA: Diagnosis not present

## 2022-12-17 DIAGNOSIS — I77810 Thoracic aortic ectasia: Secondary | ICD-10-CM | POA: Diagnosis not present

## 2022-12-17 DIAGNOSIS — R918 Other nonspecific abnormal finding of lung field: Secondary | ICD-10-CM | POA: Diagnosis not present

## 2023-01-09 ENCOUNTER — Encounter: Payer: Self-pay | Admitting: Family Medicine

## 2023-01-09 DIAGNOSIS — I700 Atherosclerosis of aorta: Secondary | ICD-10-CM | POA: Insufficient documentation

## 2023-01-09 DIAGNOSIS — I77810 Thoracic aortic ectasia: Secondary | ICD-10-CM | POA: Insufficient documentation

## 2023-01-10 ENCOUNTER — Other Ambulatory Visit: Payer: Self-pay

## 2023-01-10 MED ORDER — ATORVASTATIN CALCIUM 20 MG PO TABS: 20.0000 mg | ORAL_TABLET | Freq: Every day | ORAL | 0 refills | Status: DC

## 2023-02-23 ENCOUNTER — Telehealth: Payer: Self-pay | Admitting: Family Medicine

## 2023-02-23 NOTE — Telephone Encounter (Signed)
Copied from CRM [id number]. Topic: Clinical - Prescription Issue >> Feb 23, 2023  2:38 PM Hector Shade B wrote: Reason for CRM: patient states the medication the provider has given her for her cholesterol is given her extremely bad muscle aches and fingers are unable to close hurts to set down, she is afraid of them and would prefer to follow your diet if there's something else she can take she can. She stated since she stopped taking atorvastatin (LIPITOR) 20 MG tablet the pain is not as bad. Patients number is [id number]

## 2023-02-24 NOTE — Telephone Encounter (Signed)
Patient aware and verbalizes understanding.

## 2023-03-08 ENCOUNTER — Other Ambulatory Visit: Payer: Self-pay | Admitting: Family Medicine

## 2023-03-08 DIAGNOSIS — E039 Hypothyroidism, unspecified: Secondary | ICD-10-CM

## 2023-04-21 ENCOUNTER — Encounter: Payer: Self-pay | Admitting: Family Medicine

## 2023-04-21 ENCOUNTER — Ambulatory Visit: Payer: Self-pay | Admitting: Family Medicine

## 2023-04-21 ENCOUNTER — Ambulatory Visit (INDEPENDENT_AMBULATORY_CARE_PROVIDER_SITE_OTHER)

## 2023-04-21 ENCOUNTER — Telehealth: Payer: Self-pay | Admitting: Family Medicine

## 2023-04-21 ENCOUNTER — Ambulatory Visit: Admitting: Family Medicine

## 2023-04-21 VITALS — BP 132/86 | HR 84 | Temp 96.1°F | Ht 66.58 in | Wt 115.2 lb

## 2023-04-21 DIAGNOSIS — R52 Pain, unspecified: Secondary | ICD-10-CM | POA: Diagnosis not present

## 2023-04-21 DIAGNOSIS — I700 Atherosclerosis of aorta: Secondary | ICD-10-CM | POA: Diagnosis not present

## 2023-04-21 DIAGNOSIS — M8580 Other specified disorders of bone density and structure, unspecified site: Secondary | ICD-10-CM | POA: Diagnosis not present

## 2023-04-21 DIAGNOSIS — W19XXXA Unspecified fall, initial encounter: Secondary | ICD-10-CM | POA: Diagnosis not present

## 2023-04-21 DIAGNOSIS — S2231XA Fracture of one rib, right side, initial encounter for closed fracture: Secondary | ICD-10-CM | POA: Diagnosis not present

## 2023-04-21 DIAGNOSIS — S20221A Contusion of right back wall of thorax, initial encounter: Secondary | ICD-10-CM | POA: Diagnosis not present

## 2023-04-21 DIAGNOSIS — L0291 Cutaneous abscess, unspecified: Secondary | ICD-10-CM

## 2023-04-21 DIAGNOSIS — R0781 Pleurodynia: Secondary | ICD-10-CM | POA: Diagnosis not present

## 2023-04-21 MED ORDER — MUPIROCIN 2 % EX OINT: 1.0000 | TOPICAL_OINTMENT | Freq: Two times a day (BID) | CUTANEOUS | 0 refills | Status: DC

## 2023-04-21 MED ORDER — LIDOCAINE 5 % EX PTCH: 1.0000 | MEDICATED_PATCH | CUTANEOUS | 0 refills | Status: DC

## 2023-04-21 NOTE — Progress Notes (Signed)
 Subjective:  Patient ID: Pamela Henderson, female    DOB: 1945/12/26, 79 y.o.   MRN: [id number]  Patient Care Team: Sonny Masters, FNP as PCP - General (Family Medicine) Rosalyn Gess, MD as Consulting Physician (Obstetrics and Gynecology)   Chief Complaint:  Fall (Patient states she fell last night around 9pm and hit her right rib cadge )   HPI: Pamela Henderson is a 78 y.o. female presenting on 04/21/2023 for Fall (Patient states she fell last night around 9pm and hit her right rib cadge )   History of Present Illness   Pamela Henderson is a 78 year old female who presents with back pain after a fall.  She tripped and fell onto the edge of a desk, landing on her back. She experiences significant rib pain in the back and soreness in the front. Initially, she suspected a rib fracture due to the severity of the pain. There is a small bruise under her bra strap, which she believes contributes to her discomfort. She uses ibuprofen for pain management, and the pain worsens with breathing.  Additionally, she has a skin lesion that has been present for several weeks, described as a hard knot that has not decreased in size. She has been using rubbing alcohol wipes on it.         Relevant past medical, surgical, family, and social history reviewed and updated as indicated.  Allergies and medications reviewed and updated. Data reviewed: Chart in Epic.   Past Medical History:  Diagnosis Date   Hormone disorder    hypothyroidism   Hyperlipidemia    Insomnia    Osteopenia 08/2016   T score -1.9 FRAX 22%/12%   Thyroid disease    hypothyroid    Past Surgical History:  Procedure Laterality Date   BREAST CYST ASPIRATION Left 2002   NO PAST SURGERIES      Social History   Socioeconomic History   Marital status: Divorced    Spouse name: Not on file   Number of children: 1   Years of education: 10th grade-then got her GED   Highest education level: GED or equivalent  Occupational  History   Not on file  Tobacco Use   Smoking status: Never   Smokeless tobacco: Never  Vaping Use   Vaping status: Never Used  Substance and Sexual Activity   Alcohol use: No   Drug use: No   Sexual activity: Not Currently    Birth control/protection: None    Comment: 1st intercourse- 16, partners- 4, divorced  Other Topics Concern   Not on file  Social History Narrative   Not on file   Social Drivers of Health   Financial Resource Strain: Low Risk  (05/18/2022)   Overall Financial Resource Strain (CARDIA)    Difficulty of Paying Living Expenses: Not hard at all  Food Insecurity: No Food Insecurity (05/18/2022)   Hunger Vital Sign    Worried About Running Out of Food in the Last Year: Never true    Ran Out of Food in the Last Year: Never true  Transportation Needs: No Transportation Needs (05/18/2022)   PRAPARE - Administrator, Civil Service (Medical): No    Lack of Transportation (Non-Medical): No  Physical Activity: Insufficiently Active (05/18/2022)   Exercise Vital Sign    Days of Exercise per Week: 3 days    Minutes of Exercise per Session: 30 min  Stress: No Stress Concern Present (05/18/2022)  [employer] of Occupational Health - Occupational Stress Questionnaire    Feeling of Stress : Not at all  Social Connections: Moderately Isolated (05/18/2022)   Social Connection and Isolation Panel [NHANES]    Frequency of Communication with Friends and Family: More than three times a week    Frequency of Social Gatherings with Friends and Family: More than three times a week    Attends Religious Services: More than 4 times per year    Active Member of [employer] or Organizations: No    Attends Banker Meetings: Never    Marital Status: Divorced  Catering manager Violence: Not At Risk (05/18/2022)   Humiliation, Afraid, Rape, and Kick questionnaire    Fear of Current or Ex-Partner: No    Emotionally Abused: No    Physically Abused: No    Sexually  Abused: No    Outpatient Encounter Medications as of 04/21/2023  Medication Sig   atorvastatin (LIPITOR) 20 MG tablet Take 1 tablet (20 mg total) by mouth at bedtime.   levothyroxine (SYNTHROID) 75 MCG tablet TAKE 1 TABLET BY MOUTH EVERY DAY   lidocaine (LIDODERM) 5 % Place 1 patch onto the skin daily. Remove & Discard patch within 12 hours or as directed by MD   mupirocin ointment (BACTROBAN) 2 % Apply 1 Application topically 2 (two) times daily.   polyethylene glycol powder (GLYCOLAX/MIRALAX) 17 GM/SCOOP powder Take 17 g by mouth daily.   No facility-administered encounter medications on file as of 04/21/2023.    No Known Allergies  Pertinent ROS per HPI, otherwise unremarkable      Objective:  BP 132/86   Pulse 84   Temp (!) 96.1 F (35.6 C)   Ht 5' 6.58" (1.691 m)   Wt 115 lb 3.2 oz (52.3 kg)   BMI 18.27 kg/m    Wt Readings from Last 3 Encounters:  04/21/23 115 lb 3.2 oz (52.3 kg)  12/13/22 117 lb (53.1 kg)  12/07/22 118 lb 3.2 oz (53.6 kg)    Physical Exam Vitals and nursing note reviewed.  Constitutional:      General: She is not in acute distress.    Appearance: Normal appearance. She is normal weight. She is not ill-appearing, toxic-appearing or diaphoretic.  HENT:     Head: Normocephalic and atraumatic.     Nose: Nose normal.     Mouth/Throat:     Mouth: Mucous membranes are moist.  Eyes:     Conjunctiva/sclera: Conjunctivae normal.     Pupils: Pupils are equal, round, and reactive to light.  Cardiovascular:     Rate and Rhythm: Normal rate and regular rhythm.     Heart sounds: Normal heart sounds.  Pulmonary:     Effort: Pulmonary effort is normal.     Breath sounds: Normal breath sounds.  Chest:     Chest wall: Tenderness present. No mass, lacerations, deformity, swelling, crepitus or edema. There is no dullness to percussion.    Musculoskeletal:     Cervical back: Neck supple.  Skin:    General: Skin is warm and dry.     Capillary Refill:  Capillary refill takes less than 2 seconds.       Neurological:     General: No focal deficit present.     Mental Status: She is alert and oriented to person, place, and time.  Psychiatric:        Mood and Affect: Mood normal.        Behavior: Behavior normal.  Thought Content: Thought content normal.        Judgment: Judgment normal.    X-Ray: right ribs and chest: No acute findings. Preliminary x-ray reading by Kari Baars, FNP-C, [facility].   Results for orders placed or performed in visit on 12/07/22  CMP14+EGFR   Collection Time: 12/07/22 10:25 AM  Result Value Ref Range   Glucose 95 70 - 99 mg/dL   BUN 11 8 - 27 mg/dL   Creatinine, Ser 8.65 0.57 - 1.00 mg/dL   eGFR 76 >78 IO/NGE/9.52   BUN/Creatinine Ratio 14 12 - 28   Sodium 143 134 - 144 mmol/L   Potassium 4.3 3.5 - 5.2 mmol/L   Chloride 105 96 - 106 mmol/L   CO2 23 20 - 29 mmol/L   Calcium 9.5 8.7 - 10.3 mg/dL   Total Protein 6.6 6.0 - 8.5 g/dL   Albumin 4.2 3.8 - 4.8 g/dL   Globulin, Total 2.4 1.5 - 4.5 g/dL   Bilirubin Total 0.5 0.0 - 1.2 mg/dL   Alkaline Phosphatase 104 44 - 121 IU/L   AST 22 0 - 40 IU/L   ALT 10 0 - 32 IU/L  CBC with Differential/Platelet   Collection Time: 12/07/22 10:25 AM  Result Value Ref Range   WBC 5.5 3.4 - 10.8 x10E3/uL   RBC 4.88 3.77 - 5.28 x10E6/uL   Hemoglobin 14.6 11.1 - 15.9 g/dL   Hematocrit 84.1 32.4 - 46.6 %   MCV 93 79 - 97 fL   MCH 29.9 26.6 - 33.0 pg   MCHC 32.2 31.5 - 35.7 g/dL   RDW 40.1 02.7 - 25.3 %   Platelets 249 150 - 450 x10E3/uL   Neutrophils 52 Not Estab. %   Lymphs 40 Not Estab. %   Monocytes 6 Not Estab. %   Eos 2 Not Estab. %   Basos 0 Not Estab. %   Neutrophils Absolute 2.9 1.4 - 7.0 x10E3/uL   Lymphocytes Absolute 2.2 0.7 - 3.1 x10E3/uL   Monocytes Absolute 0.4 0.1 - 0.9 x10E3/uL   EOS (ABSOLUTE) 0.1 0.0 - 0.4 x10E3/uL   Basophils Absolute 0.0 0.0 - 0.2 x10E3/uL   Immature Granulocytes 0 Not Estab. %   Immature Grans (Abs) 0.0 0.0 - 0.1  x10E3/uL  Lipid panel   Collection Time: 12/07/22 10:25 AM  Result Value Ref Range   Cholesterol, Total 254 (H) 100 - 199 mg/dL   Triglycerides 88 0 - 149 mg/dL   HDL 67 >66 mg/dL   VLDL Cholesterol Cal 15 5 - 40 mg/dL   LDL Chol Calc (NIH) 440 (H) 0 - 99 mg/dL   Chol/HDL Ratio 3.8 0.0 - 4.4 ratio  Thyroid Panel With TSH   Collection Time: 12/07/22 10:25 AM  Result Value Ref Range   TSH 3.500 0.450 - 4.500 uIU/mL   T4, Total 7.9 4.5 - 12.0 ug/dL   T3 Uptake Ratio 26 24 - 39 %   Free Thyroxine Index 2.1 1.2 - 4.9  VITAMIN D 25 Hydroxy (Vit-D Deficiency, Fractures)   Collection Time: 12/07/22 10:25 AM  Result Value Ref Range   Vit D, 25-Hydroxy 33.4 30.0 - 100.0 ng/mL       Pertinent labs & imaging results that were available during my care of the patient were reviewed by me and considered in my medical decision making.  Assessment & Plan:  Pamela Henderson was seen today for fall.  Diagnoses and all orders for this visit:  Fall, initial encounter -     DG Ribs  Unilateral W/Chest Right; Future -     lidocaine (LIDODERM) 5 %; Place 1 patch onto the skin daily. Remove & Discard patch within 12 hours or as directed by MD  Rib pain on right side -     DG Ribs Unilateral W/Chest Right; Future -     lidocaine (LIDODERM) 5 %; Place 1 patch onto the skin daily. Remove & Discard patch within 12 hours or as directed by MD  Abscess -     mupirocin ointment (BACTROBAN) 2 %; Apply 1 Application topically 2 (two) times daily.  Contusion of right back wall of thorax, initial encounter -     DG Ribs Unilateral W/Chest Right; Future -     lidocaine (LIDODERM) 5 %; Place 1 patch onto the skin daily. Remove & Discard patch within 12 hours or as directed by MD     Assessment and Plan    Chest wall contusion She experienced a fall, landing on the edge of a desk, resulting in pain on the back and front of the chest. X-rays did not reveal any rib fractures, but there is a small bruise under the bra  strap area. A contusion of the chest wall is suspected rather than a fracture. The main concern is to avoid constriction of the lungs to prevent pneumonia. She has been using ibuprofen for pain management. - Prescribe lidocaine patches to be applied over the painful area and changed every 24 hours. - Advise against bandaging the area to avoid lung constriction. - Recommend deep breathing exercises to keep lungs inflated. - Suggest using acetaminophen for additional pain relief if needed. - Instruct to apply heat to the area to help with pain. - Advise to seek medical attention if shortness of breath or severe pain occurs. - Inform that the radiologist will review the x-ray and any changes will be communicated.  Skin abscess She has a hard nodule on the skin that has been present for several weeks without improvement. It appears to be a skin abscess. - Prescribe mupirocin topical antibiotic ointment to be applied twice daily until the abscess clears up.        Continue all other maintenance medications.  Follow up plan: Return if symptoms worsen or fail to improve.   Continue healthy lifestyle choices, including diet (rich in fruits, vegetables, and lean proteins, and low in salt and simple carbohydrates) and exercise (at least 30 minutes of moderate physical activity daily).  Educational handout given for chest wall pain  The above assessment and management plan was discussed with the patient. The patient verbalized understanding of and has agreed to the management plan. Patient is aware to call the clinic if they develop any new symptoms or if symptoms persist or worsen. Patient is aware when to return to the clinic for a follow-up visit. Patient educated on when it is appropriate to go to the emergency department.   Kari Baars, FNP-C [facility] [phone]

## 2023-04-21 NOTE — Telephone Encounter (Signed)
 Copied from CRM [id number]. Topic: Clinical - Prescription Issue >> Apr 21, 2023  1:07 PM Fuller Mandril wrote: Reason for CRM: Patient called. She says she was not able to get the patches that were ordered. She does know if Marcelino Duster would like to just cancel them or try to get them approved. Possibly require prior authorization. Thank You

## 2023-04-21 NOTE — Telephone Encounter (Signed)
 Copied from CRM [id number]. Topic: Clinical - Red Word Triage >> Apr 21, 2023  7:49 AM Dimitri Ped wrote: [facility] that prompted transfer to Nurse Triage: patient fell hit her back on the corner of the desk and her ribs are hurting . Wanting to be seen and possibly a xray Reason for Disposition  [1] MODERATE weakness (i.e., interferes with work, school, normal activities) AND [2] new-onset or worsening  Answer Assessment - Initial Assessment Questions 1. MECHANISM: "How did the fall happen?"     I fell and hit my ribs late last night.   I tripped over a cord and hit against the corner of a desk.  Every time I take a deep breath it hurts on the right side.   I'm wondering if I broke a rib.   It even hurts to lift my right arm up. 2. DOMESTIC VIOLENCE AND ELDER ABUSE SCREENING: "Did you fall because someone pushed you or tried to hurt you?" If Yes, ask: "Are you safe now?"     N/A 3. ONSET: "When did the fall happen?" (e.g., minutes, hours, or days ago)     Last night 4. LOCATION: "What part of the body hit the ground?" (e.g., back, buttocks, head, hips, knees, hands, head, stomach)     My ribs on the right side 5. INJURY: "Did you hurt (injure) yourself when you fell?" If Yes, ask: "What did you injure? Tell me more about this?" (e.g., body area; type of injury; pain severity)"     Yes see above 6. PAIN: "Is there any pain?" If Yes, ask: "How bad is the pain?" (e.g., Scale 1-10; or mild,  moderate, severe)   - NONE (0): No pain   - MILD (1-3): Doesn't interfere with normal activities    - MODERATE (4-7): Interferes with normal activities or awakens from sleep    - SEVERE (8-10): Excruciating pain, unable to do any normal activities      Severe 7. SIZE: For cuts, bruises, or swelling, ask: "How large is it?" (e.g., inches or centimeters)      Looks like it's skinned there 8. PREGNANCY: "Is there any chance you are pregnant?" "When was your last menstrual period?"     N/A 9. OTHER SYMPTOMS: "Do  you have any other symptoms?" (e.g., dizziness, fever, weakness; new onset or worsening).      Hurrying and tripped over a cord to a charger. 10. CAUSE: "What do you think caused the fall (or falling)?" (e.g., tripped, dizzy spell)       I tripped over a charging a cord.   Every time I move it hurts in my ribs.   It hit on the back too.  Protocols used: Falls and [facility]  Chief Complaint: I fell and hit my ribs on the right side last night.  I tripped over a charging cord. Symptoms: Severe pain in right ribs even hurts to lift my right arm up and taking a deep breath is very painful Frequency: Happened last night Pertinent Negatives: Patient denies any other injuries Disposition: [] ED /[] Urgent Care (no appt availability in office) / [x] Appointment(In office/virtual)/ []  [facility]/ [] Home Care/ [] Refused Recommended Disposition /[] [facility]/ []  Follow-up with PCP Additional Notes: Appt made for today at 12:05 with Gilford Silvius, FNP

## 2023-04-21 NOTE — Telephone Encounter (Signed)
Noted  -LS

## 2023-04-23 ENCOUNTER — Encounter: Payer: Self-pay | Admitting: Nurse Practitioner

## 2023-04-23 DIAGNOSIS — R0781 Pleurodynia: Secondary | ICD-10-CM

## 2023-04-23 DIAGNOSIS — W19XXXA Unspecified fall, initial encounter: Secondary | ICD-10-CM

## 2023-04-23 DIAGNOSIS — S20221A Contusion of right back wall of thorax, initial encounter: Secondary | ICD-10-CM

## 2023-04-23 MED ORDER — LIDOCAINE 5 % EX PTCH: 1.0000 | MEDICATED_PATCH | CUTANEOUS | 1 refills | Status: AC

## 2023-04-24 ENCOUNTER — Telehealth: Payer: Self-pay | Admitting: Family Medicine

## 2023-04-24 ENCOUNTER — Telehealth: Payer: Self-pay

## 2023-04-24 NOTE — Telephone Encounter (Unsigned)
 Copied from CRM [id number]. Topic: Clinical - Prescription Issue >> Apr 21, 2023  1:07 PM Fuller Mandril wrote: Reason for CRM: Patient called. She says she was not able to get the patches that were ordered. She does know if Marcelino Duster would like to just cancel them or try to get them approved. Possibly require prior authorization. Thank You >> Apr 24, 2023 10:04 AM Smith Mince wrote: Lynnae Sandhoff w/[employer]  ref# [id number] BJ#[id number] fax#[phone] faxing over 7 questions to approve laticane medicine.. pls f/u if need anything.

## 2023-04-24 NOTE — Telephone Encounter (Signed)
 Pharmacy Patient Advocate Encounter   Received notification from Pt Calls Messages that prior authorization for Lidocaine 5% patches is required/requested.   Insurance verification completed.   The patient is insured through [employer] .   Per test claim: PA required; PA submitted to above mentioned insurance via Phone Key/confirmation #/EOC [id number] Status is pending

## 2023-04-24 NOTE — Telephone Encounter (Signed)
Refer to previous telephone call.

## 2023-04-25 ENCOUNTER — Other Ambulatory Visit (HOSPITAL_COMMUNITY): Payer: Self-pay

## 2023-04-25 NOTE — Telephone Encounter (Signed)
 Pharmacy Patient Advocate Encounter  Received notification from [facility] that Prior Authorization for LIDOCAINE PATCHES has been DENIED.  Full denial letter will be uploaded to the media tab. See denial reason below.

## 2023-04-26 NOTE — Telephone Encounter (Signed)
 Patient aware and verbalizes understanding.

## 2023-05-17 DIAGNOSIS — L814 Other melanin hyperpigmentation: Secondary | ICD-10-CM | POA: Diagnosis not present

## 2023-05-17 DIAGNOSIS — L570 Actinic keratosis: Secondary | ICD-10-CM | POA: Diagnosis not present

## 2023-05-17 DIAGNOSIS — Z85828 Personal history of other malignant neoplasm of skin: Secondary | ICD-10-CM | POA: Diagnosis not present

## 2023-05-17 DIAGNOSIS — L821 Other seborrheic keratosis: Secondary | ICD-10-CM | POA: Diagnosis not present

## 2023-05-23 ENCOUNTER — Ambulatory Visit (INDEPENDENT_AMBULATORY_CARE_PROVIDER_SITE_OTHER)

## 2023-05-23 VITALS — BP 132/86 | HR 84 | Ht 66.0 in | Wt 115.0 lb

## 2023-05-23 DIAGNOSIS — Z0000 Encounter for general adult medical examination without abnormal findings: Secondary | ICD-10-CM | POA: Diagnosis not present

## 2023-05-23 NOTE — Patient Instructions (Signed)
 Ms. Wimer , Thank you for taking time to come for your Medicare Wellness Visit. I appreciate your ongoing commitment to your health goals. Please review the following plan we discussed and let me know if I can assist you in the future.   Referrals/Orders/Follow-Ups/Clinician Recommendations: n/a  This is a list of the screening recommended for you and due dates:  Health Maintenance  Topic Date Due   DEXA scan (bone density measurement)  09/07/2018   Hepatitis C Screening  12/07/2023*   COVID-19 Vaccine (3 - 2024-25 season) 06/07/2024*   Zoster (Shingles) Vaccine (1 of 2) 08/21/2024*   Flu Shot  09/01/2023   Medicare Annual Wellness Visit  05/22/2024   Pneumonia Vaccine  Completed   HPV Vaccine  Aged Out   Meningitis B Vaccine  Aged Out   DTaP/Tdap/Td vaccine  Discontinued   Colon Cancer Screening  Discontinued  *Topic was postponed. The date shown is not the original due date.    Advanced directives: (Declined) Advance directive discussed with you today. Even though you declined this today, please call our office should you change your mind, and we can give you the proper paperwork for you to fill out.  Next Medicare Annual Wellness Visit scheduled for next year: Yes

## 2023-05-23 NOTE — Progress Notes (Signed)
 Subjective:   RAYVEN RETTIG is a 78 y.o. who presents for a Medicare Wellness preventive visit.  Visit Complete: Virtual I connected with  Royal Cordon on 05/23/23 by a audio enabled telemedicine application and verified that I am speaking with the correct person using two identifiers.  Patient Location: Home  Provider Location: Home Office  I discussed the limitations of evaluation and management by telemedicine. The patient expressed understanding and agreed to proceed.  Vital Signs: Because this visit was a virtual/telehealth visit, some criteria may be missing or patient reported. Any vitals not documented were not able to be obtained and vitals that have been documented are patient reported.  VideoDeclined- This patient declined Librarian, academic. Therefore the visit was completed with audio only.  Persons Participating in Visit: Patient.  AWV Questionnaire: No: Patient Medicare AWV questionnaire was not completed prior to this visit.  Cardiac Risk Factors include: advanced age (>32men, >87 women)     Objective:    Today's Vitals   05/23/23 0922  BP: 132/86  Pulse: 84  Weight: 115 lb (52.2 kg)  Height: 5\' 6"  (1.676 m)   Body mass index is 18.56 kg/m.     05/23/2023    9:31 AM 07/19/2022   10:37 AM 05/18/2022    2:58 PM 05/12/2021    2:37 PM 04/07/2020    9:56 AM 10/16/2018    8:39 AM 09/25/2017    8:17 AM  Advanced Directives  Does Patient Have a Medical Advance Directive? No Yes Yes Yes No No No  Type of Best boy of [place];Living will Healthcare Power of [place];Living will   Healthcare Power of Attorney  Copy of Healthcare Power of Attorney in Chart?   No - copy requested No - copy requested   No - copy requested  Would patient like information on creating a medical advance directive?     No - Patient declined No - Patient declined Yes (MAU/Ambulatory/Procedural Areas - Information given)    Current  Medications (verified) Outpatient Encounter Medications as of 05/23/2023  Medication Sig   atorvastatin  (LIPITOR) 20 MG tablet Take 1 tablet (20 mg total) by mouth at bedtime.   levothyroxine  (SYNTHROID ) 75 MCG tablet TAKE 1 TABLET BY MOUTH EVERY DAY   lidocaine  (LIDODERM ) 5 % Place 1 patch onto the skin daily. Remove & Discard patch within 12 hours or as directed by MD   mupirocin  ointment (BACTROBAN ) 2 % Apply 1 Application topically 2 (two) times daily.   polyethylene glycol powder (GLYCOLAX /MIRALAX ) 17 GM/SCOOP powder Take 17 g by mouth daily.   No facility-administered encounter medications on file as of 05/23/2023.    Allergies (verified) Patient has no known allergies.   History: Past Medical History:  Diagnosis Date   Hormone disorder    hypothyroidism   Hyperlipidemia    Insomnia    Osteopenia 08/2016   T score -1.9 FRAX 22%/12%   Thyroid  disease    hypothyroid   Past Surgical History:  Procedure Laterality Date   BREAST CYST ASPIRATION Left 2002   NO PAST SURGERIES     Family History  Problem Relation Age of Onset   Cancer Father 32       lung- smoker   Hypertension Brother    Cancer Brother        lung   Stroke Mother    Breast cancer Maternal Grandmother    Social History   Socioeconomic History   Marital status: Divorced  Spouse name: Not on file   Number of children: 1   Years of education: 10th grade-then got her GED   Highest education level: GED or equivalent  Occupational History   Not on file  Tobacco Use   Smoking status: Never   Smokeless tobacco: Never  Vaping Use   Vaping status: Never Used  Substance and Sexual Activity   Alcohol use: No   Drug use: No   Sexual activity: Not Currently    Birth control/protection: None    Comment: 1st intercourse- 16, partners- 4, divorced  Other Topics Concern   Not on file  Social History Narrative   Not on file   Social Drivers of Health   Financial Resource Strain: Low Risk  (05/23/2023)    Overall Financial Resource Strain (CARDIA)    Difficulty of Paying Living Expenses: Not hard at all  Food Insecurity: No Food Insecurity (05/23/2023)   Hunger Vital Sign    Worried About Running Out of Food in the Last Year: Never true    Ran Out of Food in the Last Year: Never true  Transportation Needs: No Transportation Needs (05/23/2023)   PRAPARE - Administrator, Civil Service (Medical): No    Lack of Transportation (Non-Medical): No  Physical Activity: Sufficiently Active (05/23/2023)   Exercise Vital Sign    Days of Exercise per Week: 5 days    Minutes of Exercise per Session: 30 min  Stress: No Stress Concern Present (05/23/2023)   [employer] of Occupational Health - Occupational Stress Questionnaire    Feeling of Stress : Not at all  Social Connections: Moderately Isolated (05/23/2023)   Social Connection and Isolation Panel [NHANES]    Frequency of Communication with Friends and Family: Three times a week    Frequency of Social Gatherings with Friends and Family: Three times a week    Attends Religious Services: 1 to 4 times per year    Active Member of Clubs or Organizations: No    Attends Banker Meetings: Never    Marital Status: Divorced    Tobacco Counseling Counseling given: Yes    Clinical Intake:  Pre-visit preparation completed: Yes  Pain : No/denies pain     BMI - recorded: 18.56 Nutritional Status: BMI <19  Underweight Nutritional Risks: None Diabetes: No  Lab Results  Component Value Date   HGBA1C 5.6 04/15/2020     How often do you need to have someone help you when you read instructions, pamphlets, or other written materials from your doctor or pharmacy?: 1 - Never  Interpreter Needed?: No  Information entered by :: Anola Basques T/CMA   Activities of Daily Living     05/23/2023    9:30 AM  In your present state of health, do you have any difficulty performing the following activities:  Hearing? 0  Vision? 0   Difficulty concentrating or making decisions? 0  Walking or climbing stairs? 0  Dressing or bathing? 0  Doing errands, shopping? 0  Preparing Food and eating ? N  Using the Toilet? N  In the past six months, have you accidently leaked urine? Y  Do you have problems with loss of bowel control? N  Managing your Medications? N  Managing your Finances? N  Housekeeping or managing your Housekeeping? N    Patient Care Team: Galvin Jules, FNP as PCP - General (Family Medicine) Romaine Closs, MD as Consulting Physician (Obstetrics and Gynecology)  Indicate any recent Medical Services you may  have received from other than [facility] providers in the past year (date may be approximate).     Assessment:   This is a routine wellness examination for Kayia.  Hearing/Vision screen Hearing Screening - Comments:: Pt denies hearing dif Vision Screening - Comments:: Pt denies vision dif [facility] [place], [place]   Goals Addressed               This Visit's Progress     Patient Stated (pt-stated)   On track     " I would like to help the people in my community"       Depression Screen     05/23/2023    9:28 AM 12/07/2022   10:08 AM 09/06/2022    9:00 AM 07/12/2022    8:17 AM 05/18/2022    2:57 PM 02/17/2022   12:29 PM 08/05/2021   11:15 AM  PHQ 2/9 Scores  PHQ - 2 Score 0  0 0 0 0 0  PHQ- 9 Score 1  2   3 1   Exception Documentation  Patient refusal         Fall Risk     05/23/2023    9:27 AM 04/21/2023   10:58 AM 12/13/2022   11:09 AM 09/06/2022    9:00 AM 07/12/2022    8:17 AM  Fall Risk   Falls in the past year? 1 1 0 0 1  Number falls in past yr: 0 0 0  0  Injury with Fall? 1 1 0  0  Risk for fall due to : Impaired balance/gait;Impaired mobility History of fall(s) No Fall Risks    Follow up Falls prevention discussed;Falls evaluation completed Falls evaluation completed Falls evaluation completed  Falls prevention discussed    MEDICARE RISK AT HOME:  Medicare  Risk at Home Any stairs in or around the home?: Yes If so, are there any without handrails?: Yes Home free of loose throw rugs in walkways, pet beds, electrical cords, etc?: Yes Adequate lighting in your home to reduce risk of falls?: Yes Life alert?: No Use of a cane, walker or w/c?: No Grab bars in the bathroom?: Yes Shower chair or bench in shower?: No Elevated toilet seat or a handicapped toilet?: Yes  TIMED UP AND GO:  Was the test performed?  No  Cognitive Function: 6CIT completed    09/25/2017    8:21 AM  MMSE - Mini Mental State Exam  Orientation to time 5  Orientation to Place 5  Registration 3  Attention/ Calculation 5  Recall 3  Language- name 2 objects 2  Language- repeat 1  Language- follow 3 step command 3  Language- read & follow direction 1  Write a sentence 1  Copy design 1  Total score 30        05/23/2023    9:36 AM 05/18/2022    2:59 PM 04/07/2020   10:00 AM 10/16/2018    8:44 AM 09/25/2017    8:23 AM  6CIT Screen  What Year? 0 points 0 points 0 points 0 points 0 points  What month? 0 points 0 points 0 points 0 points 0 points  What time? 0 points 0 points 0 points 0 points 0 points  Count back from 20 0 points 0 points 0 points 0 points 0 points  Months in reverse 0 points 0 points 4 points 0 points 0 points  Repeat phrase 0 points 0 points 0 points 0 points 0 points  Total Score  0 points 0 points 4 points 0 points 0 points    Immunizations Immunization History  Administered Date(s) Administered   Fluad Quad(high Dose 65+) 11/27/2020, 01/17/2022   Fluad Trivalent(High Dose 65+) 12/07/2022   Influenza, High Dose Seasonal PF 11/17/2016, 11/06/2017, 12/08/2018   Moderna Sars-Covid-2 Vaccination 04/12/2019, 05/15/2019   PNEUMOCOCCAL CONJUGATE-20 12/07/2022    Screening Tests Health Maintenance  Topic Date Due   DEXA SCAN  09/07/2018   Hepatitis C Screening  12/07/2023 (Originally 07/31/1963)   COVID-19 Vaccine (3 - 2024-25 season) 06/07/2024  (Originally 10/02/2022)   Zoster Vaccines- Shingrix (1 of 2) 08/21/2024 (Originally 07/31/1993)   INFLUENZA VACCINE  09/01/2023   Medicare Annual Wellness (AWV)  05/22/2024   Pneumonia Vaccine 23+ Years old  Completed   HPV VACCINES  Aged Out   Meningococcal B Vaccine  Aged Out   DTaP/Tdap/Td  Discontinued   Colonoscopy  Discontinued    Health Maintenance  Health Maintenance Due  Topic Date Due   DEXA SCAN  09/07/2018   Health Maintenance Items Addressed: See Nurse Notes  Additional Screening:  Vision Screening: Recommended annual ophthalmology exams for early detection of glaucoma and other disorders of the eye.  Dental Screening: Recommended annual dental exams for proper oral hygiene  Community Resource Referral / Chronic Care Management: CRR required this visit?  No   CCM required this visit?  No     Plan:     I have personally reviewed and noted the following in the patient's chart:   Medical and social history Use of alcohol, tobacco or illicit drugs  Current medications and supplements including opioid prescriptions. Patient is not currently taking opioid prescriptions. Functional ability and status Nutritional status Physical activity Advanced directives List of other physicians Hospitalizations, surgeries, and ER visits in previous 12 months Vitals Screenings to include cognitive, depression, and falls Referrals and appointments  In addition, I have reviewed and discussed with patient certain preventive protocols, quality metrics, and best practice recommendations. A written personalized care plan for preventive services as well as general preventive health recommendations were provided to patient.     Michaelle Adolphus, CMA   05/23/2023   After Visit Summary: (Declined) Due to this being a telephonic visit, with patients personalized plan was offered to patient but patient Declined AVS at this time   Notes: Nothing significant to report at this time.

## 2023-05-24 ENCOUNTER — Ambulatory Visit (INDEPENDENT_AMBULATORY_CARE_PROVIDER_SITE_OTHER)

## 2023-05-24 DIAGNOSIS — Z23 Encounter for immunization: Secondary | ICD-10-CM

## 2023-05-24 NOTE — Progress Notes (Signed)
 Patient is in office today for a nurse visit for  Tetanus immunization . Patient Injection was given in the  Left deltoid. Patient tolerated injection well.

## 2023-05-25 ENCOUNTER — Other Ambulatory Visit: Payer: Self-pay | Admitting: Family Medicine

## 2023-05-25 DIAGNOSIS — Z1231 Encounter for screening mammogram for malignant neoplasm of breast: Secondary | ICD-10-CM

## 2023-06-04 ENCOUNTER — Other Ambulatory Visit: Payer: Self-pay | Admitting: Family Medicine

## 2023-06-04 DIAGNOSIS — E039 Hypothyroidism, unspecified: Secondary | ICD-10-CM

## 2023-06-06 ENCOUNTER — Ambulatory Visit: Payer: Medicare HMO | Admitting: Family Medicine

## 2023-06-06 ENCOUNTER — Other Ambulatory Visit: Payer: Medicare HMO

## 2023-06-06 DIAGNOSIS — R0781 Pleurodynia: Secondary | ICD-10-CM | POA: Diagnosis not present

## 2023-06-07 ENCOUNTER — Ambulatory Visit
Admission: RE | Admit: 2023-06-07 | Discharge: 2023-06-07 | Disposition: A | Discharge status: Home / Self Care | Source: Ambulatory Visit | Attending: Family Medicine | Admitting: Family Medicine

## 2023-06-07 DIAGNOSIS — Z1231 Encounter for screening mammogram for malignant neoplasm of breast: Secondary | ICD-10-CM | POA: Diagnosis not present

## 2023-06-13 DIAGNOSIS — S2231XD Fracture of one rib, right side, subsequent encounter for fracture with routine healing: Secondary | ICD-10-CM | POA: Diagnosis not present

## 2023-07-18 DIAGNOSIS — R21 Rash and other nonspecific skin eruption: Secondary | ICD-10-CM | POA: Diagnosis not present

## 2023-10-04 ENCOUNTER — Other Ambulatory Visit: Payer: Self-pay | Admitting: Family Medicine

## 2023-10-04 DIAGNOSIS — E039 Hypothyroidism, unspecified: Secondary | ICD-10-CM

## 2023-10-04 NOTE — Telephone Encounter (Signed)
 Pamela Henderson in Nov  RF sent to pharmacy

## 2023-10-04 NOTE — Telephone Encounter (Signed)
 Patient has appt 10-23-2023 with [facility].

## 2023-10-18 DIAGNOSIS — Z961 Presence of intraocular lens: Secondary | ICD-10-CM | POA: Diagnosis not present

## 2023-10-18 DIAGNOSIS — H2512 Age-related nuclear cataract, left eye: Secondary | ICD-10-CM | POA: Diagnosis not present

## 2023-10-18 DIAGNOSIS — H43813 Vitreous degeneration, bilateral: Secondary | ICD-10-CM | POA: Diagnosis not present

## 2023-10-18 DIAGNOSIS — L821 Other seborrheic keratosis: Secondary | ICD-10-CM | POA: Diagnosis not present

## 2023-10-18 DIAGNOSIS — H0102B Squamous blepharitis left eye, upper and lower eyelids: Secondary | ICD-10-CM | POA: Diagnosis not present

## 2023-10-18 DIAGNOSIS — H26491 Other secondary cataract, right eye: Secondary | ICD-10-CM | POA: Diagnosis not present

## 2023-10-18 DIAGNOSIS — H0102A Squamous blepharitis right eye, upper and lower eyelids: Secondary | ICD-10-CM | POA: Diagnosis not present

## 2023-10-20 ENCOUNTER — Encounter: Payer: Self-pay | Admitting: Family Medicine

## 2023-10-20 ENCOUNTER — Ambulatory Visit (INDEPENDENT_AMBULATORY_CARE_PROVIDER_SITE_OTHER): Admitting: Family Medicine

## 2023-10-20 VITALS — BP 112/75 | HR 74 | Temp 95.2°F | Ht 66.0 in | Wt 115.0 lb

## 2023-10-20 DIAGNOSIS — M8589 Other specified disorders of bone density and structure, multiple sites: Secondary | ICD-10-CM

## 2023-10-20 DIAGNOSIS — E039 Hypothyroidism, unspecified: Secondary | ICD-10-CM | POA: Diagnosis not present

## 2023-10-20 DIAGNOSIS — L720 Epidermal cyst: Secondary | ICD-10-CM | POA: Diagnosis not present

## 2023-10-20 DIAGNOSIS — Z23 Encounter for immunization: Secondary | ICD-10-CM

## 2023-10-20 DIAGNOSIS — L659 Nonscarring hair loss, unspecified: Secondary | ICD-10-CM

## 2023-10-20 DIAGNOSIS — I700 Atherosclerosis of aorta: Secondary | ICD-10-CM

## 2023-10-20 DIAGNOSIS — E782 Mixed hyperlipidemia: Secondary | ICD-10-CM

## 2023-10-20 DIAGNOSIS — R6889 Other general symptoms and signs: Secondary | ICD-10-CM | POA: Diagnosis not present

## 2023-10-20 LAB — LIPID PANEL

## 2023-10-20 MED ORDER — ATORVASTATIN CALCIUM 20 MG PO TABS: 20.0000 mg | ORAL_TABLET | Freq: Every day | ORAL | 1 refills | Status: AC

## 2023-10-20 NOTE — Progress Notes (Signed)
 Subjective:  Patient ID: Pamela Henderson, female    DOB: 10-Oct-1945, 78 y.o.   MRN: [id number]  Patient Care Team: Severa Rock HERO, FNP as PCP - General (Family Medicine) Dallie Vera GAILS, MD as Consulting Physician (Obstetrics and Gynecology)   Chief Complaint:  Medical Management of Chronic Issues (6 month follow up )   HPI: Pamela Henderson is a 78 y.o. female presenting on 10/20/2023 for Medical Management of Chronic Issues (6 month follow up )   Pamela Henderson is an 78 year old female who presents for chronic follow up. She has complaints of hair thinning and she states she has recovered from her rib fractures.  She has a history of rib fractures, diagnosed two weeks after the initial injury due to a delayed radiology reading. She reports that the rib pain was one of the worst pains she has ever had and that it hurt every time she moved or breathed. An orthopedic surgeon fitted her with a brace, which has helped alleviate the pressure and pain.  She has a history of thyroid  issues and is currently experiencing hair thinning. She notes daily thinning of her hair and takes levothyroxine  regularly for her thyroid  condition. There is a family history of thyroid  problems, as her mother also had a thyroid  issue.  She mentions significant weight loss in the past, having lost 30 to 40 pounds, but her weight has been stable recently at around 115 pounds. She is considering increasing her protein intake to help with her overall health and hair thinning.  She has noticed a cyst on her skin that has been present for a while, with a similar one on the other side. She describes it as a small cyst resembling a blackhead.  She is active, still mowing the yard and weeding at the age of 69. No significant changes in skin or nails. No significant weight changes recently.          Relevant past medical, surgical, family, and social history reviewed and updated as indicated.  Allergies and medications  reviewed and updated. Data reviewed: Chart in Epic.   Past Medical History:  Diagnosis Date   Hormone disorder    hypothyroidism   Hyperlipidemia    Insomnia    Osteopenia 08/2016   T score -1.9 FRAX 22%/12%   Thyroid  disease    hypothyroid    Past Surgical History:  Procedure Laterality Date   BREAST CYST ASPIRATION Left 2002   NO PAST SURGERIES      Social History   Socioeconomic History   Marital status: Divorced    Spouse name: Not on file   Number of children: 1   Years of education: 10th grade-then got her GED   Highest education level: GED or equivalent  Occupational History   Not on file  Tobacco Use   Smoking status: Never   Smokeless tobacco: Never  Vaping Use   Vaping status: Never Used  Substance and Sexual Activity   Alcohol use: No   Drug use: No   Sexual activity: Not Currently    Birth control/protection: None    Comment: 1st intercourse- 16, partners- 4, divorced  Other Topics Concern   Not on file  Social History Narrative   Not on file   Social Drivers of Health   Financial Resource Strain: Low Risk  (05/23/2023)   Overall Financial Resource Strain (CARDIA)    Difficulty of Paying Living Expenses: Not hard at all  Food Insecurity:  No Food Insecurity (05/23/2023)   Hunger Vital Sign    Worried About Running Out of Food in the Last Year: Never true    Ran Out of Food in the Last Year: Never true  Transportation Needs: No Transportation Needs (05/23/2023)   PRAPARE - Administrator, Civil Service (Medical): No    Lack of Transportation (Non-Medical): No  Physical Activity: Sufficiently Active (05/23/2023)   Exercise Vital Sign    Days of Exercise per Week: 5 days    Minutes of Exercise per Session: 30 min  Stress: No Stress Concern Present (05/23/2023)   [employer] of Occupational Health - Occupational Stress Questionnaire    Feeling of Stress : Not at all  Social Connections: Moderately Isolated (05/23/2023)   Social  Connection and Isolation Panel    Frequency of Communication with Friends and Family: Three times a week    Frequency of Social Gatherings with Friends and Family: Three times a week    Attends Religious Services: 1 to 4 times per year    Active Member of Clubs or Organizations: No    Attends Banker Meetings: Never    Marital Status: Divorced  Catering manager Violence: Not At Risk (05/23/2023)   Humiliation, Afraid, Rape, and Kick questionnaire    Fear of Current or Ex-Partner: No    Emotionally Abused: No    Physically Abused: No    Sexually Abused: No    Outpatient Encounter Medications as of 10/20/2023  Medication Sig   levothyroxine  (SYNTHROID ) 75 MCG tablet TAKE 1 TABLET BY MOUTH DAILY -OK TO CHANGE MANUFACTUER MACLEODS   polyethylene glycol powder (GLYCOLAX /MIRALAX ) 17 GM/SCOOP powder Take 17 g by mouth daily.   [DISCONTINUED] atorvastatin  (LIPITOR) 20 MG tablet Take 1 tablet (20 mg total) by mouth at bedtime.   [DISCONTINUED] mupirocin  ointment (BACTROBAN ) 2 % Apply 1 Application topically 2 (two) times daily.   atorvastatin  (LIPITOR) 20 MG tablet Take 1 tablet (20 mg total) by mouth at bedtime.   No facility-administered encounter medications on file as of 10/20/2023.    No Known Allergies  Pertinent ROS per HPI, otherwise unremarkable      Objective:  BP 112/75   Pulse 74   Temp (!) 95.2 F (35.1 C)   Ht 5' 6 (1.676 m)   Wt 115 lb (52.2 kg)   SpO2 98%   BMI 18.56 kg/m    Wt Readings from Last 3 Encounters:  10/20/23 115 lb (52.2 kg)  05/23/23 115 lb (52.2 kg)  04/21/23 115 lb 3.2 oz (52.3 kg)    Physical Exam Vitals and nursing note reviewed.  Constitutional:      General: She is not in acute distress.    Appearance: Normal appearance. She is not ill-appearing, toxic-appearing or diaphoretic.  HENT:     Head: Normocephalic and atraumatic.     Nose: Nose normal.     Mouth/Throat:     Mouth: Mucous membranes are moist.  Eyes:      Conjunctiva/sclera: Conjunctivae normal.     Pupils: Pupils are equal, round, and reactive to light.  Neck:     Thyroid : No thyroid  mass, thyromegaly or thyroid  tenderness.  Cardiovascular:     Rate and Rhythm: Normal rate and regular rhythm.     Heart sounds: Normal heart sounds.  Pulmonary:     Effort: Pulmonary effort is normal.     Breath sounds: Normal breath sounds.  Skin:    General: Skin is warm and dry.  Capillary Refill: Capillary refill takes less than 2 seconds.      Neurological:     General: No focal deficit present.     Mental Status: She is alert and oriented to person, place, and time.  Psychiatric:        Mood and Affect: Mood normal.        Behavior: Behavior normal.        Thought Content: Thought content normal.        Judgment: Judgment normal.    I & D  Date/Time: 10/20/2023 9:57 AM  Performed by: Severa Rock HERO, FNP Authorized by: Severa Rock HERO, FNP   Consent:    Consent obtained:  Verbal   Consent given by:  Patient   Risks, benefits, and alternatives were discussed: yes     Risks discussed:  Bleeding, incomplete drainage, pain, infection and damage to other organs   Alternatives discussed:  No treatment, delayed treatment, alternative treatment, observation and referral Universal protocol:    Procedure explained and questions answered to patient or proxy's satisfaction: yes     Relevant documents present and verified: yes     Immediately prior to procedure a time out was called: yes     Patient identity confirmed:  Verbally with patient Location:    Type:  Cyst (3 mm to right lateral neck, 4 mm to left lateral neck)   Location:  Neck   Neck location: left and right lateral. Pre-procedure details:    Skin preparation:  Alcohol and povidone-iodine Sedation:    Sedation type:  None Anesthesia:    Anesthesia method:  None Procedure type:    Complexity:  Simple Procedure details:    Needle aspiration: no     Incision types:  Stab  incision   Incision depth:  Dermal   Scalpel blade:  11   Wound management:  Probed and deloculated   Drainage characteristics: white, thick.   Drainage amount:  Moderate   Wound treatment:  Wound left open   Packing materials:  None Post-procedure details:    Procedure completion:  Tolerated well, no immediate complications   Results for orders placed or performed in visit on 12/07/22  CMP14+EGFR   Collection Time: 12/07/22 10:25 AM  Result Value Ref Range   Glucose 95 70 - 99 mg/dL   BUN 11 8 - 27 mg/dL   Creatinine, Ser 9.20 0.57 - 1.00 mg/dL   eGFR 76 >40 fO/fpw/8.26   BUN/Creatinine Ratio 14 12 - 28   Sodium 143 134 - 144 mmol/L   Potassium 4.3 3.5 - 5.2 mmol/L   Chloride 105 96 - 106 mmol/L   CO2 23 20 - 29 mmol/L   Calcium  9.5 8.7 - 10.3 mg/dL   Total Protein 6.6 6.0 - 8.5 g/dL   Albumin 4.2 3.8 - 4.8 g/dL   Globulin, Total 2.4 1.5 - 4.5 g/dL   Bilirubin Total 0.5 0.0 - 1.2 mg/dL   Alkaline Phosphatase 104 44 - 121 IU/L   AST 22 0 - 40 IU/L   ALT 10 0 - 32 IU/L  CBC with Differential/Platelet   Collection Time: 12/07/22 10:25 AM  Result Value Ref Range   WBC 5.5 3.4 - 10.8 x10E3/uL   RBC 4.88 3.77 - 5.28 x10E6/uL   Hemoglobin 14.6 11.1 - 15.9 g/dL   Hematocrit 54.6 65.9 - 46.6 %   MCV 93 79 - 97 fL   MCH 29.9 26.6 - 33.0 pg   MCHC 32.2 31.5 - 35.7 g/dL  RDW 12.8 11.7 - 15.4 %   Platelets 249 150 - 450 x10E3/uL   Neutrophils 52 Not Estab. %   Lymphs 40 Not Estab. %   Monocytes 6 Not Estab. %   Eos 2 Not Estab. %   Basos 0 Not Estab. %   Neutrophils Absolute 2.9 1.4 - 7.0 x10E3/uL   Lymphocytes Absolute 2.2 0.7 - 3.1 x10E3/uL   Monocytes Absolute 0.4 0.1 - 0.9 x10E3/uL   EOS (ABSOLUTE) 0.1 0.0 - 0.4 x10E3/uL   Basophils Absolute 0.0 0.0 - 0.2 x10E3/uL   Immature Granulocytes 0 Not Estab. %   Immature Grans (Abs) 0.0 0.0 - 0.1 x10E3/uL  Lipid panel   Collection Time: 12/07/22 10:25 AM  Result Value Ref Range   Cholesterol, Total 254 (H) 100 - 199 mg/dL    Triglycerides 88 0 - 149 mg/dL   HDL 67 >60 mg/dL   VLDL Cholesterol Cal 15 5 - 40 mg/dL   LDL Chol Calc (NIH) 827 (H) 0 - 99 mg/dL   Chol/HDL Ratio 3.8 0.0 - 4.4 ratio  Thyroid  Panel With TSH   Collection Time: 12/07/22 10:25 AM  Result Value Ref Range   TSH 3.500 0.450 - 4.500 uIU/mL   T4, Total 7.9 4.5 - 12.0 ug/dL   T3 Uptake Ratio 26 24 - 39 %   Free Thyroxine Index 2.1 1.2 - 4.9  VITAMIN D  25 Hydroxy (Vit-D Deficiency, Fractures)   Collection Time: 12/07/22 10:25 AM  Result Value Ref Range   Vit D, 25-Hydroxy 33.4 30.0 - 100.0 ng/mL       Pertinent labs & imaging results that were available during my care of the patient were reviewed by me and considered in my medical decision making.  Assessment & Plan:  Keyetta was seen today for medical management of chronic issues.  Diagnoses and all orders for this visit:  Acquired hypothyroidism -     Lipid panel -     Thyroid  Panel With TSH -     Vitamin B12 -     Anemia Profile B  Mixed hyperlipidemia -     Lipid panel -     CMP14+EGFR  Aortic atherosclerosis (HCC) -     Lipid panel -     atorvastatin  (LIPITOR) 20 MG tablet; Take 1 tablet (20 mg total) by mouth at bedtime. -     CMP14+EGFR  Osteopenia of multiple sites -     DG [facility] DEXA  Epidermoid cyst of neck -     I & D  Hair loss -     CMP14+EGFR -     Thyroid  Panel With TSH -     VITAMIN D  25 Hydroxy (Vit-D Deficiency, Fractures) -     Vitamin B12 -     Anemia Profile B  Encounter for immunization -     Flu vaccine HIGH DOSE PF(Fluzone Trivalent)         Hair loss Hair thinning possibly related to vitamin D  deficiency or protein intake. No significant changes in skin or nails. - Recommend Vivascal vitamin for hair growth - Check vitamin D  and B12 levels - Check protein levels - Encourage increased protein intake with options like Ensure or protein powders  Hypothyroidism Well-managed on current levothyroxine  regimen. No significant changes in  symptoms. - Continue current levothyroxine  regimen - Order thyroid  panel  Osteopenia Osteopenia with previous rib fractures. Risk of progression to osteoporosis. Recent rib fractures managed with a brace, risk of pneumonia due to restricted movement. -  Order DEXA scan to assess bone density  Mixed hyperlipidemia No specific discussion regarding mixed hyperlipidemia. - Order cholesterol panel  Cutaneous cysts Small cysts, similar to blackheads, present for a significant duration. - Remove cysts manually        I spent 40 minutes dedicated to the care of this patient on the date of this encounter to include pre-visit review of records including diagnostic studies and labs, face-to-face time with the patient discussing above conditions, post visit ordering of testing, clinical documentation in the electronic health record, making appropriate referrals as documented, and communicating necessary information to the patient's healthcare team.    Continue all other maintenance medications.  Follow up plan: Return in about 6 months (around 04/18/2024), or if symptoms worsen or fail to improve, for Annual Physical.   Continue healthy lifestyle choices, including diet (rich in fruits, vegetables, and lean proteins, and low in salt and simple carbohydrates) and exercise (at least 30 minutes of moderate physical activity daily).  Educational handout given for osteopenia   The above assessment and management plan was discussed with the patient. The patient verbalized understanding of and has agreed to the management plan. Patient is aware to call the clinic if they develop any new symptoms or if symptoms persist or worsen. Patient is aware when to return to the clinic for a follow-up visit. Patient educated on when it is appropriate to go to the emergency department.   Rosaline Bruns, FNP-C [facility] [phone]

## 2023-10-20 NOTE — Patient Instructions (Addendum)
 Viviscal Pro - vitamin for hair growth

## 2023-10-21 LAB — CMP14+EGFR
ALT: 11 IU/L (ref 0–32)
AST: 17 IU/L (ref 0–40)
Albumin: 4.4 g/dL (ref 3.8–4.8)
Alkaline Phosphatase: 96 IU/L (ref 49–135)
BUN/Creatinine Ratio: 15 (ref 12–28)
BUN: 11 mg/dL (ref 8–27)
Bilirubin Total: 0.5 mg/dL (ref 0.0–1.2)
CO2: 23 mmol/L (ref 20–29)
Calcium: 9.5 mg/dL (ref 8.7–10.3)
Chloride: 106 mmol/L (ref 96–106)
Creatinine, Ser: 0.74 mg/dL (ref 0.57–1.00)
Globulin, Total: 2.3 g/dL (ref 1.5–4.5)
Glucose: 92 mg/dL (ref 70–99)
Potassium: 4.4 mmol/L (ref 3.5–5.2)
Sodium: 143 mmol/L (ref 134–144)
Total Protein: 6.7 g/dL (ref 6.0–8.5)
eGFR: 82 mL/min/1.73 (ref >59)

## 2023-10-21 LAB — ANEMIA PROFILE B
Basophils Absolute: 0 x10E3/uL (ref 0.0–0.2)
Basos: 1 %
EOS (ABSOLUTE): 0.1 x10E3/uL (ref 0.0–0.4)
Eos: 1 %
Ferritin: 127 ng/mL (ref 15–150)
Folate: 7.3 ng/mL (ref >3.0)
Hematocrit: 42.8 % (ref 34.0–46.6)
Hemoglobin: 13.6 g/dL (ref 11.1–15.9)
Immature Grans (Abs): 0 x10E3/uL (ref 0.0–0.1)
Immature Granulocytes: 0 %
Iron Saturation: 39 % (ref 15–55)
Iron: 114 ug/dL (ref 27–139)
Lymphocytes Absolute: 1.8 x10E3/uL (ref 0.7–3.1)
Lymphs: 34 %
MCH: 29.9 pg (ref 26.6–33.0)
MCHC: 31.8 g/dL (ref 31.5–35.7)
MCV: 94 fL (ref 79–97)
Monocytes Absolute: 0.4 x10E3/uL (ref 0.1–0.9)
Monocytes: 6 %
Neutrophils Absolute: 3.2 x10E3/uL (ref 1.4–7.0)
Neutrophils: 58 %
Platelets: 232 x10E3/uL (ref 150–450)
RBC: 4.55 x10E6/uL (ref 3.77–5.28)
RDW: 13 % (ref 11.7–15.4)
Retic Ct Pct: 0.9 % (ref 0.6–2.6)
Total Iron Binding Capacity: 290 ug/dL (ref 250–450)
UIBC: 176 ug/dL (ref 118–369)
Vitamin B-12: 1197 pg/mL (ref 232–1245)
WBC: 5.5 x10E3/uL (ref 3.4–10.8)

## 2023-10-21 LAB — THYROID PANEL WITH TSH
Free Thyroxine Index: 3.1 (ref 1.2–4.9)
T3 Uptake Ratio: 31 % (ref 24–39)
T4, Total: 9.9 ug/dL (ref 4.5–12.0)
TSH: 1.47 u[IU]/mL (ref 0.450–4.500)

## 2023-10-21 LAB — LIPID PANEL
Chol/HDL Ratio: 4 ratio (ref 0.0–4.4)
Cholesterol, Total: 258 mg/dL — ABNORMAL HIGH (ref 100–199)
HDL: 64 mg/dL (ref >39)
LDL Chol Calc (NIH): 176 mg/dL — ABNORMAL HIGH (ref 0–99)
Triglycerides: 102 mg/dL (ref 0–149)
VLDL Cholesterol Cal: 18 mg/dL (ref 5–40)

## 2023-10-21 LAB — VITAMIN D 25 HYDROXY (VIT D DEFICIENCY, FRACTURES): Vit D, 25-Hydroxy: 32.7 ng/mL (ref 30.0–100.0)

## 2023-10-22 ENCOUNTER — Ambulatory Visit: Payer: Self-pay | Admitting: Family Medicine

## 2023-10-25 DIAGNOSIS — H2512 Age-related nuclear cataract, left eye: Secondary | ICD-10-CM | POA: Diagnosis not present

## 2023-11-02 DIAGNOSIS — H2512 Age-related nuclear cataract, left eye: Secondary | ICD-10-CM | POA: Diagnosis not present

## 2023-11-20 DIAGNOSIS — L570 Actinic keratosis: Secondary | ICD-10-CM | POA: Diagnosis not present

## 2023-11-20 DIAGNOSIS — L821 Other seborrheic keratosis: Secondary | ICD-10-CM | POA: Diagnosis not present

## 2023-11-20 DIAGNOSIS — Z85828 Personal history of other malignant neoplasm of skin: Secondary | ICD-10-CM | POA: Diagnosis not present

## 2023-11-20 DIAGNOSIS — L280 Lichen simplex chronicus: Secondary | ICD-10-CM | POA: Diagnosis not present

## 2023-12-31 ENCOUNTER — Other Ambulatory Visit: Payer: Self-pay | Admitting: Family Medicine

## 2023-12-31 DIAGNOSIS — E039 Hypothyroidism, unspecified: Secondary | ICD-10-CM

## 2024-05-02 ENCOUNTER — Encounter: Payer: Self-pay | Admitting: Family Medicine

## 2024-05-02 ENCOUNTER — Other Ambulatory Visit
